# Patient Record
Sex: Female | Born: 1965 | Race: White | Hispanic: No | Marital: Married | Smoking: Never smoker
Health system: Southern US, Community
[De-identification: ages and names within clinical notes are randomized; demographics above are authoritative.]

## PROBLEM LIST (undated history)

## (undated) DIAGNOSIS — F419 Anxiety disorder, unspecified: Secondary | ICD-10-CM

## (undated) DIAGNOSIS — B019 Varicella without complication: Secondary | ICD-10-CM

## (undated) DIAGNOSIS — Z8669 Personal history of other diseases of the nervous system and sense organs: Secondary | ICD-10-CM

## (undated) HISTORY — DX: Varicella without complication: B01.9

## (undated) HISTORY — DX: Anxiety disorder, unspecified: F41.9

## (undated) HISTORY — DX: Personal history of other diseases of the nervous system and sense organs: Z86.69

## (undated) HISTORY — PX: BREAST CYST ASPIRATION: SHX578

## (undated) HISTORY — PX: WISDOM TOOTH EXTRACTION: SHX21

---

## 2000-02-09 ENCOUNTER — Other Ambulatory Visit: Admission: RE | Admit: 2000-02-09 | Discharge: 2000-02-09 | Payer: Self-pay | Admitting: Obstetrics and Gynecology

## 2001-04-04 ENCOUNTER — Other Ambulatory Visit: Admission: RE | Admit: 2001-04-04 | Discharge: 2001-04-04 | Payer: Self-pay | Admitting: Obstetrics and Gynecology

## 2002-06-19 ENCOUNTER — Other Ambulatory Visit: Admission: RE | Admit: 2002-06-19 | Discharge: 2002-06-19 | Payer: Self-pay | Admitting: Obstetrics and Gynecology

## 2004-05-02 ENCOUNTER — Ambulatory Visit: Payer: Self-pay | Admitting: Internal Medicine

## 2004-05-23 ENCOUNTER — Ambulatory Visit: Payer: Self-pay

## 2004-11-21 ENCOUNTER — Ambulatory Visit: Payer: Self-pay

## 2005-08-02 ENCOUNTER — Ambulatory Visit: Payer: Self-pay

## 2006-08-06 ENCOUNTER — Ambulatory Visit: Payer: Self-pay | Admitting: Internal Medicine

## 2007-08-22 ENCOUNTER — Ambulatory Visit: Payer: Self-pay | Admitting: Internal Medicine

## 2007-08-27 ENCOUNTER — Ambulatory Visit: Payer: Self-pay | Admitting: Internal Medicine

## 2008-09-17 ENCOUNTER — Ambulatory Visit: Payer: Self-pay | Admitting: Internal Medicine

## 2008-12-03 ENCOUNTER — Ambulatory Visit: Payer: Self-pay | Admitting: Internal Medicine

## 2009-05-28 HISTORY — PX: BREAST SURGERY: SHX581

## 2009-09-19 ENCOUNTER — Ambulatory Visit: Payer: Self-pay | Admitting: Internal Medicine

## 2010-02-15 ENCOUNTER — Ambulatory Visit: Payer: Self-pay

## 2010-08-07 ENCOUNTER — Ambulatory Visit: Payer: Self-pay

## 2011-02-08 ENCOUNTER — Ambulatory Visit: Payer: Self-pay | Admitting: Surgery

## 2012-03-28 ENCOUNTER — Ambulatory Visit: Payer: Self-pay

## 2013-05-14 ENCOUNTER — Ambulatory Visit: Payer: Self-pay

## 2014-04-26 ENCOUNTER — Encounter: Payer: Self-pay | Admitting: Internal Medicine

## 2014-04-26 ENCOUNTER — Ambulatory Visit (INDEPENDENT_AMBULATORY_CARE_PROVIDER_SITE_OTHER): Payer: 59 | Admitting: Internal Medicine

## 2014-04-26 ENCOUNTER — Encounter (INDEPENDENT_AMBULATORY_CARE_PROVIDER_SITE_OTHER): Payer: Self-pay

## 2014-04-26 VITALS — BP 106/70 | HR 55 | Temp 98.0°F | Ht 62.75 in | Wt 125.0 lb

## 2014-04-26 DIAGNOSIS — F411 Generalized anxiety disorder: Secondary | ICD-10-CM

## 2014-04-26 DIAGNOSIS — Z1211 Encounter for screening for malignant neoplasm of colon: Secondary | ICD-10-CM

## 2014-04-26 DIAGNOSIS — G43919 Migraine, unspecified, intractable, without status migrainosus: Secondary | ICD-10-CM

## 2014-04-26 DIAGNOSIS — G43909 Migraine, unspecified, not intractable, without status migrainosus: Secondary | ICD-10-CM | POA: Insufficient documentation

## 2014-04-26 DIAGNOSIS — Z0000 Encounter for general adult medical examination without abnormal findings: Secondary | ICD-10-CM

## 2014-04-26 MED ORDER — ALPRAZOLAM 0.25 MG PO TABS: 0.1250 mg | ORAL_TABLET | Freq: Every evening | ORAL | Status: DC | PRN

## 2014-04-26 MED ORDER — PROPRANOLOL HCL ER 60 MG PO CP24: 60.0000 mg | ORAL_CAPSULE | Freq: Every day | ORAL | Status: DC

## 2014-04-26 NOTE — Assessment & Plan Note (Signed)
Rare xanax use Medication refilled today

## 2014-04-26 NOTE — Assessment & Plan Note (Signed)
Well controlled on propanolol Medication refilled today

## 2014-04-26 NOTE — Progress Notes (Signed)
HPI  Pt presents to the clinic today to establish care. She is transferring care from [facility].  Flu: 02/2014 Tetanus: < 10 years ago LMP: 10/2012 Pap Smear: 2014- normal Mammogram: 03/2013 Colon Screening: never Visions Screening: yearly Dentist: biannually  Migraines: Has has migraines for the last 25-30 years. Rare migraines since she has been on the propanolol. Maybe 1 migraine per year. They are relieved by advil and sleeping in quiet/dark room.  Anxiety: Rare. Takes 1/2 xanax every 2-3 weeks to help take the edge off.  Past Medical History  Diagnosis Date  . Chicken pox   . History of migraine   . Anxiety     Current Outpatient Prescriptions  Medication Sig Dispense Refill  . ALPRAZolam (XANAX) 0.25 MG tablet Take 0.125 mg by mouth at bedtime as needed for anxiety.    . propranolol ER (INDERAL LA) 60 MG 24 hr capsule Take 60 mg by mouth daily.     No current facility-administered medications for this visit.    No Known Allergies  Family History  Problem Relation Age of Onset  . Arthritis Mother   . Arthritis Father     History   Social History  . Marital Status: Married    Spouse Name: N/A    Number of Children: N/A  . Years of Education: N/A   Occupational History  . Not on file.   Social History Main Topics  . Smoking status: Never Smoker   . Smokeless tobacco: Never Used  . Alcohol Use: 0.0 oz/week    0 Not specified per week     Comment: occasional  . Drug Use: Not on file  . Sexual Activity: Not on file   Other Topics Concern  . Not on file   Social History Narrative  . No narrative on file    ROS:  Constitutional: Denies fever, malaise, fatigue, headache or abrupt weight changes.  HEENT: Denies eye pain, eye redness, ear pain, ringing in the ears, wax buildup, runny nose, nasal congestion, bloody nose, or sore throat. Respiratory: Denies difficulty breathing, shortness of breath, cough or sputum production.   Cardiovascular:  Denies chest pain, chest tightness, palpitations or swelling in the hands or feet.  Gastrointestinal: Denies abdominal pain, bloating, constipation, diarrhea or blood in the stool.  GU: Denies frequency, urgency, pain with urination, blood in urine, odor or discharge. Musculoskeletal: Denies decrease in range of motion, difficulty with gait, muscle pain or joint pain and swelling.  Skin: Denies redness, rashes, lesions or ulcercations.  Neurological: Denies dizziness, difficulty with memory, difficulty with speech or problems with balance and coordination.   No other specific complaints in a complete review of systems (except as listed in HPI above).  PE:  BP 106/70 mmHg  Pulse 55  Temp(Src) 98 F (36.7 C) (Oral)  Ht 5' 2.75" (1.594 m)  Wt 125 lb (56.7 kg)  BMI 22.32 kg/m2  SpO2 99%  LMP 10/26/2012 Wt Readings from Last 3 Encounters:  04/26/14 125 lb (56.7 kg)    General: Appears her stated age, well developed, well nourished in NAD. HEENT: Head: normal shape and size; Eyes: sclera white, no icterus, conjunctiva pink, PERRLA and EOMs intact; Ears: Tm's gray and intact, normal light reflex; Nose: mucosa pink and moist, septum midline; Throat/Mouth: Teeth present, mucosa pink and moist, no lesions or ulcerations noted.  Neck: Neck supple, trachea midline. No masses, lumps or thyromegaly present.  Cardiovascular: Normal rate and rhythm. S1,S2 noted.  No murmur, rubs or gallops noted.  No JVD or BLE edema. No carotid bruits noted. Pulmonary/Chest: Normal effort and positive vesicular breath sounds. No respiratory distress. No wheezes, rales or ronchi noted.  Abdomen: Soft and nontender. Normal bowel sounds, no bruits noted. No distention or masses noted. Liver, spleen and kidneys non palpable. Musculoskeletal: Normal range of motion. Strength 5/5 BUE/BLE. No difficulty with gait.  Neurological: Alert and oriented. Cranial nerves II-XII grossly intact. Coordination normal.  Psychiatric:  Mood and affect normal. Behavior is normal. Judgment and thought content normal.     Assessment and Plan:  Preventative Health Maintenance:  She declines colonoscopy- will have her do IFOB yearly Will check CBC, CMET and Lipid profile All HM UTD Form filled out for insurance  RTC in 1 year or sooner if needed

## 2014-04-26 NOTE — Patient Instructions (Signed)

## 2014-04-26 NOTE — Progress Notes (Signed)
Pre visit review using our clinic review tool, if applicable. No additional management support is needed unless otherwise documented below in the visit note. 

## 2014-04-27 LAB — LIPID PANEL
CHOL/HDL RATIO: 3
Cholesterol: 186 mg/dL (ref 0–200)
HDL: 67.6 mg/dL (ref >39.00)
LDL Cholesterol: 101 mg/dL — ABNORMAL HIGH (ref 0–99)
NonHDL: 118.4
TRIGLYCERIDES: 89 mg/dL (ref 0.0–149.0)
VLDL: 17.8 mg/dL (ref 0.0–40.0)

## 2014-04-27 LAB — CBC
HCT: 38.9 % (ref 36.0–46.0)
HEMOGLOBIN: 12.8 g/dL (ref 12.0–15.0)
MCHC: 32.9 g/dL (ref 30.0–36.0)
MCV: 89.8 fl (ref 78.0–100.0)
PLATELETS: 291 10*3/uL (ref 150.0–400.0)
RBC: 4.33 Mil/uL (ref 3.87–5.11)
RDW: 13 % (ref 11.5–15.5)
WBC: 7.4 10*3/uL (ref 4.0–10.5)

## 2014-04-27 LAB — COMPREHENSIVE METABOLIC PANEL
ALBUMIN: 4.7 g/dL (ref 3.5–5.2)
ALT: 16 U/L (ref 0–35)
AST: 23 U/L (ref 0–37)
Alkaline Phosphatase: 63 U/L (ref 39–117)
BUN: 17 mg/dL (ref 6–23)
CALCIUM: 10 mg/dL (ref 8.4–10.5)
CHLORIDE: 100 meq/L (ref 96–112)
CO2: 30 meq/L (ref 19–32)
CREATININE: 0.8 mg/dL (ref 0.4–1.2)
GFR: 83.03 mL/min (ref >60.00)
Glucose, Bld: 84 mg/dL (ref 70–99)
POTASSIUM: 4.9 meq/L (ref 3.5–5.1)
Sodium: 137 mEq/L (ref 135–145)
Total Bilirubin: 0.7 mg/dL (ref 0.2–1.2)
Total Protein: 7.6 g/dL (ref 6.0–8.3)

## 2014-05-25 ENCOUNTER — Ambulatory Visit: Payer: Self-pay | Admitting: Internal Medicine

## 2014-05-26 ENCOUNTER — Other Ambulatory Visit: Payer: Self-pay | Admitting: Internal Medicine

## 2014-05-26 ENCOUNTER — Encounter: Payer: Self-pay | Admitting: Internal Medicine

## 2014-05-26 DIAGNOSIS — R928 Other abnormal and inconclusive findings on diagnostic imaging of breast: Secondary | ICD-10-CM

## 2014-05-28 HISTORY — PX: BREAST BIOPSY: SHX20

## 2014-06-03 ENCOUNTER — Ambulatory Visit: Payer: Self-pay | Admitting: Internal Medicine

## 2014-06-03 ENCOUNTER — Encounter: Payer: Self-pay | Admitting: Internal Medicine

## 2014-06-08 ENCOUNTER — Encounter: Payer: Self-pay | Admitting: Internal Medicine

## 2014-06-08 ENCOUNTER — Ambulatory Visit: Payer: Self-pay | Admitting: Internal Medicine

## 2014-06-11 ENCOUNTER — Encounter: Payer: Self-pay | Admitting: Internal Medicine

## 2014-09-20 LAB — SURGICAL PATHOLOGY

## 2014-10-27 ENCOUNTER — Other Ambulatory Visit: Payer: Self-pay

## 2014-10-27 DIAGNOSIS — F411 Generalized anxiety disorder: Secondary | ICD-10-CM

## 2014-10-27 DIAGNOSIS — G43919 Migraine, unspecified, intractable, without status migrainosus: Secondary | ICD-10-CM

## 2014-10-27 MED ORDER — PROPRANOLOL HCL ER 60 MG PO CP24: 60.0000 mg | ORAL_CAPSULE | Freq: Every day | ORAL | Status: DC

## 2014-10-27 MED ORDER — ALPRAZOLAM 0.25 MG PO TABS: 0.1250 mg | ORAL_TABLET | Freq: Every evening | ORAL | Status: DC | PRN

## 2014-10-27 NOTE — Telephone Encounter (Signed)
Rx called in to pharmacy. 

## 2014-10-27 NOTE — Telephone Encounter (Signed)
Propanolol sent to pharmacy electronically Ok to phone in Xanax

## 2014-10-27 NOTE — Telephone Encounter (Signed)
Pt left v/m requesting refill alprazolam to [facility]. And Inderal to [facility]. Pt seen to establish care 04/26/14;no future appt scheduled.Please advise.

## 2014-11-03 MED ORDER — PROPRANOLOL HCL ER 60 MG PO CP24: 60.0000 mg | ORAL_CAPSULE | Freq: Every day | ORAL | Status: DC

## 2014-11-03 NOTE — Addendum Note (Signed)
Addended by: Lurlean Nanny on: 11/03/2014 12:21 PM   Modules accepted: Orders

## 2014-11-03 NOTE — Telephone Encounter (Signed)
[facility] requires MD signature only--will resend in Dr Hulen Shouts name

## 2014-11-04 MED ORDER — PROPRANOLOL HCL ER 60 MG PO CP24: 60.0000 mg | ORAL_CAPSULE | Freq: Every day | ORAL | Status: DC

## 2014-11-04 NOTE — Addendum Note (Signed)
Addended by: Lurlean Nanny on: 11/04/2014 01:46 PM   Modules accepted: Orders

## 2015-03-25 ENCOUNTER — Other Ambulatory Visit: Payer: Self-pay | Admitting: Internal Medicine

## 2015-04-28 ENCOUNTER — Encounter: Payer: Self-pay | Admitting: Internal Medicine

## 2015-04-28 ENCOUNTER — Ambulatory Visit (INDEPENDENT_AMBULATORY_CARE_PROVIDER_SITE_OTHER): Payer: Commercial Managed Care - PPO | Admitting: Internal Medicine

## 2015-04-28 ENCOUNTER — Other Ambulatory Visit: Payer: Self-pay | Admitting: *Deleted

## 2015-04-28 VITALS — BP 110/74 | HR 83 | Temp 98.2°F | Ht 62.33 in | Wt 126.0 lb

## 2015-04-28 DIAGNOSIS — Z0000 Encounter for general adult medical examination without abnormal findings: Secondary | ICD-10-CM | POA: Diagnosis not present

## 2015-04-28 DIAGNOSIS — G43919 Migraine, unspecified, intractable, without status migrainosus: Secondary | ICD-10-CM | POA: Diagnosis not present

## 2015-04-28 DIAGNOSIS — F411 Generalized anxiety disorder: Secondary | ICD-10-CM | POA: Diagnosis not present

## 2015-04-28 DIAGNOSIS — Z1211 Encounter for screening for malignant neoplasm of colon: Secondary | ICD-10-CM

## 2015-04-28 LAB — LIPID PANEL
CHOLESTEROL: 156 mg/dL (ref 0–200)
HDL: 63.2 mg/dL (ref >39.00)
LDL CALC: 61 mg/dL (ref 0–99)
NonHDL: 92.43
TRIGLYCERIDES: 155 mg/dL — AB (ref 0.0–149.0)
Total CHOL/HDL Ratio: 2
VLDL: 31 mg/dL (ref 0.0–40.0)

## 2015-04-28 LAB — COMPREHENSIVE METABOLIC PANEL
ALBUMIN: 4.4 g/dL (ref 3.5–5.2)
ALT: 18 U/L (ref 0–35)
AST: 20 U/L (ref 0–37)
Alkaline Phosphatase: 66 U/L (ref 39–117)
BUN: 18 mg/dL (ref 6–23)
CALCIUM: 9.9 mg/dL (ref 8.4–10.5)
CO2: 32 meq/L (ref 19–32)
CREATININE: 0.89 mg/dL (ref 0.40–1.20)
Chloride: 101 mEq/L (ref 96–112)
GFR: 71.02 mL/min (ref >60.00)
Glucose, Bld: 100 mg/dL — ABNORMAL HIGH (ref 70–99)
Potassium: 4.4 mEq/L (ref 3.5–5.1)
SODIUM: 137 meq/L (ref 135–145)
Total Bilirubin: 0.5 mg/dL (ref 0.2–1.2)
Total Protein: 7.6 g/dL (ref 6.0–8.3)

## 2015-04-28 LAB — CBC
HCT: 38.8 % (ref 36.0–46.0)
Hemoglobin: 12.9 g/dL (ref 12.0–15.0)
MCHC: 33.3 g/dL (ref 30.0–36.0)
MCV: 88.6 fl (ref 78.0–100.0)
Platelets: 273 10*3/uL (ref 150.0–400.0)
RBC: 4.38 Mil/uL (ref 3.87–5.11)
RDW: 13.1 % (ref 11.5–15.5)
WBC: 6.6 10*3/uL (ref 4.0–10.5)

## 2015-04-28 LAB — VITAMIN D 25 HYDROXY (VIT D DEFICIENCY, FRACTURES): VITD: 35.53 ng/mL (ref 30.00–100.00)

## 2015-04-28 MED ORDER — ALPRAZOLAM 0.25 MG PO TABS: 0.1250 mg | ORAL_TABLET | Freq: Every evening | ORAL | Status: DC | PRN

## 2015-04-28 NOTE — Patient Instructions (Signed)
Health Maintenance, Female Adopting a healthy lifestyle and getting preventive care can go a long way to promote health and wellness. Talk with your health care provider about what schedule of regular examinations is right for you. This is a good chance for you to check in with your provider about disease prevention and staying healthy. In between checkups, there are plenty of things you can do on your own. Experts have done a lot of research about which lifestyle changes and preventive measures are most likely to keep you healthy. Ask your health care provider for more information. WEIGHT AND DIET  Eat a healthy diet  Be sure to include plenty of vegetables, fruits, low-fat dairy products, and lean protein.  Do not eat a lot of foods high in solid fats, added sugars, or salt.  Get regular exercise. This is one of the most important things you can do for your health.  Most adults should exercise for at least 150 minutes each week. The exercise should increase your heart rate and make you sweat (moderate-intensity exercise).  Most adults should also do strengthening exercises at least twice a week. This is in addition to the moderate-intensity exercise.  Maintain a healthy weight  Body mass index (BMI) is a measurement that can be used to identify possible weight problems. It estimates body fat based on height and weight. Your health care provider can help determine your BMI and help you achieve or maintain a healthy weight.  For females 20 years of age and older:   A BMI below 18.5 is considered underweight.  A BMI of 18.5 to 24.9 is normal.  A BMI of 25 to 29.9 is considered overweight.  A BMI of 30 and above is considered obese.  Watch levels of cholesterol and blood lipids  You should start having your blood tested for lipids and cholesterol at 51 years of age, then have this test every 5 years.  You may need to have your cholesterol levels checked more often if:  Your lipid  or cholesterol levels are high.  You are older than 50 years of age.  You are at high risk for heart disease.  CANCER SCREENING   Lung Cancer  Lung cancer screening is recommended for adults 55-80 years old who are at high risk for lung cancer because of a history of smoking.  A yearly low-dose CT scan of the lungs is recommended for people who:  Currently smoke.  Have quit within the past 15 years.  Have at least a 30-pack-year history of smoking. A pack year is smoking an average of one pack of cigarettes a day for 1 year.  Yearly screening should continue until it has been 15 years since you quit.  Yearly screening should stop if you develop a health problem that would prevent you from having lung cancer treatment.  Breast Cancer  Practice breast self-awareness. This means understanding how your breasts normally appear and feel.  It also means doing regular breast self-exams. Let your health care provider know about any changes, no matter how small.  If you are in your 20s or 30s, you should have a clinical breast exam (CBE) by a health care provider every 1-3 years as part of a regular health exam.  If you are 40 or older, have a CBE every year. Also consider having a breast X-ray (mammogram) every year.  If you have a family history of breast cancer, talk to your health care provider about genetic screening.  If you   are at high risk for breast cancer, talk to your health care provider about having an MRI and a mammogram every year.  Breast cancer gene (BRCA) assessment is recommended for women who have family members with BRCA-related cancers. BRCA-related cancers include:  Breast.  Ovarian.  Tubal.  Peritoneal cancers.  Results of the assessment will determine the need for genetic counseling and BRCA1 and BRCA2 testing. Cervical Cancer Your health care provider may recommend that you be screened regularly for cancer of the pelvic organs (ovaries, uterus, and  vagina). This screening involves a pelvic examination, including checking for microscopic changes to the surface of your cervix (Pap test). You may be encouraged to have this screening done every 3 years, beginning at age 21.  For women ages 30-65, health care providers may recommend pelvic exams and Pap testing every 3 years, or they may recommend the Pap and pelvic exam, combined with testing for human papilloma virus (HPV), every 5 years. Some types of HPV increase your risk of cervical cancer. Testing for HPV may also be done on women of any age with unclear Pap test results.  Other health care providers may not recommend any screening for nonpregnant women who are considered low risk for pelvic cancer and who do not have symptoms. Ask your health care provider if a screening pelvic exam is right for you.  If you have had past treatment for cervical cancer or a condition that could lead to cancer, you need Pap tests and screening for cancer for at least 20 years after your treatment. If Pap tests have been discontinued, your risk factors (such as having a new sexual partner) need to be reassessed to determine if screening should resume. Some women have medical problems that increase the chance of getting cervical cancer. In these cases, your health care provider may recommend more frequent screening and Pap tests. Colorectal Cancer  This type of cancer can be detected and often prevented.  Routine colorectal cancer screening usually begins at 50 years of age and continues through 51 years of age.  Your health care provider may recommend screening at an earlier age if you have risk factors for colon cancer.  Your health care provider may also recommend using home test kits to check for hidden blood in the stool.  A small camera at the end of a tube can be used to examine your colon directly (sigmoidoscopy or colonoscopy). This is done to check for the earliest forms of colorectal  cancer.  Routine screening usually begins at age 50.  Direct examination of the colon should be repeated every 5-10 years through 51 years of age. However, you may need to be screened more often if early forms of precancerous polyps or small growths are found. Skin Cancer  Check your skin from head to toe regularly.  Tell your health care provider about any new moles or changes in moles, especially if there is a change in a mole's shape or color.  Also tell your health care provider if you have a mole that is larger than the size of a pencil eraser.  Always use sunscreen. Apply sunscreen liberally and repeatedly throughout the day.  Protect yourself by wearing long sleeves, pants, a wide-brimmed hat, and sunglasses whenever you are outside. HEART DISEASE, DIABETES, AND HIGH BLOOD PRESSURE   High blood pressure causes heart disease and increases the risk of stroke. High blood pressure is more likely to develop in:  People who have blood pressure in the high end   of the normal range (130-139/85-89 mm Hg).  People who are overweight or obese.  People who are African American.  If you are 38-23 years of age, have your blood pressure checked every 3-5 years. If you are 61 years of age or older, have your blood pressure checked every year. You should have your blood pressure measured twice--once when you are at a hospital or clinic, and once when you are not at a hospital or clinic. Record the average of the two measurements. To check your blood pressure when you are not at a hospital or clinic, you can use:  An automated blood pressure machine at a pharmacy.  A home blood pressure monitor.  If you are between 45 years and 39 years old, ask your health care provider if you should take aspirin to prevent strokes.  Have regular diabetes screenings. This involves taking a blood sample to check your fasting blood sugar level.  If you are at a normal weight and have a low risk for diabetes,  have this test once every three years after 51 years of age.  If you are overweight and have a high risk for diabetes, consider being tested at a younger age or more often. PREVENTING INFECTION  Hepatitis B  If you have a higher risk for hepatitis B, you should be screened for this virus. You are considered at high risk for hepatitis B if:  You were born in a country where hepatitis B is common. Ask your health care provider which countries are considered high risk.  Your parents were born in a high-risk country, and you have not been immunized against hepatitis B (hepatitis B vaccine).  You have HIV or AIDS.  You use needles to inject street drugs.  You live with someone who has hepatitis B.  You have had sex with someone who has hepatitis B.  You get hemodialysis treatment.  You take certain medicines for conditions, including cancer, organ transplantation, and autoimmune conditions. Hepatitis C  Blood testing is recommended for:  Everyone born from [birth year] through 1967.  Anyone with known risk factors for hepatitis C. Sexually transmitted infections (STIs)  You should be screened for sexually transmitted infections (STIs) including gonorrhea and chlamydia if:  You are sexually active and are younger than 51 years of age.  You are older than 51 years of age and your health care provider tells you that you are at risk for this type of infection.  Your sexual activity has changed since you were last screened and you are at an increased risk for chlamydia or gonorrhea. Ask your health care provider if you are at risk.  If you do not have HIV, but are at risk, it may be recommended that you take a prescription medicine daily to prevent HIV infection. This is called pre-exposure prophylaxis (PrEP). You are considered at risk if:  You are sexually active and do not regularly use condoms or know the HIV status of your partner(s).  You take drugs by injection.  You are sexually  active with a partner who has HIV. Talk with your health care provider about whether you are at high risk of being infected with HIV. If you choose to begin PrEP, you should first be tested for HIV. You should then be tested every 3 months for as long as you are taking PrEP.  PREGNANCY   If you are premenopausal and you may become pregnant, ask your health care provider about preconception counseling.  If you may  become pregnant, take 400 to 800 micrograms (mcg) of folic acid every day.  If you want to prevent pregnancy, talk to your health care provider about birth control (contraception). OSTEOPOROSIS AND MENOPAUSE   Osteoporosis is a disease in which the bones lose minerals and strength with aging. This can result in serious bone fractures. Your risk for osteoporosis can be identified using a bone density scan.  If you are 61 years of age or older, or if you are at risk for osteoporosis and fractures, ask your health care provider if you should be screened.  Ask your health care provider whether you should take a calcium or vitamin D supplement to lower your risk for osteoporosis.  Menopause may have certain physical symptoms and risks.  Hormone replacement therapy may reduce some of these symptoms and risks. Talk to your health care provider about whether hormone replacement therapy is right for you.  HOME CARE INSTRUCTIONS   Schedule regular health, dental, and eye exams.  Stay current with your immunizations.   Do not use any tobacco products including cigarettes, chewing tobacco, or electronic cigarettes.  If you are pregnant, do not drink alcohol.  If you are breastfeeding, limit how much and how often you drink alcohol.  Limit alcohol intake to no more than 1 drink per day for nonpregnant women. One drink equals 12 ounces of beer, 5 ounces of wine, or 1 ounces of hard liquor.  Do not use street drugs.  Do not share needles.  Ask your health care provider for help if  you need support or information about quitting drugs.  Tell your health care provider if you often feel depressed.  Tell your health care provider if you have ever been abused or do not feel safe at home.   This information is not intended to replace advice given to you by your health care provider. Make sure you discuss any questions you have with your health care provider.   Document Released: 11/27/2010 Document Revised: 06/04/2014 Document Reviewed: 04/15/2013 Elsevier Interactive Patient Education [employer].

## 2015-04-28 NOTE — Progress Notes (Signed)
Pre visit review using our clinic review tool, if applicable. No additional management support is needed unless otherwise documented below in the visit note. 

## 2015-04-28 NOTE — Assessment & Plan Note (Signed)
Controlled on Propanolol

## 2015-04-28 NOTE — Assessment & Plan Note (Signed)
Controlled on Xanax prn

## 2015-04-28 NOTE — Progress Notes (Signed)
Subjective:    Patient ID: Veronica Leach, female    DOB: [birth year], 49 y.o.   MRN: [id number]  HPI  Pt presents to the clinic today for her annual exam. She is also due to follow up chronic conditions (see separate note)  Flu: 02/2015 Tetanus: < 10 years ago Pap Smear: 2014- normal Mammogram: 04/2014 Colon Screening: never Vision Screening: yearly Dentist: biannually  Diet: She does consume meat occasionally. She eats fruits and veggies daily. She tries to avoid fried foods. She drinks mostly green tea. Exercise: She walks 4 days a week.   Review of Systems      Past Medical History  Diagnosis Date  . Chicken pox   . History of migraine   . Anxiety     Current Outpatient Prescriptions  Medication Sig Dispense Refill  . ALPRAZolam (XANAX) 0.25 MG tablet Take 0.5 tablets (0.125 mg total) by mouth at bedtime as needed for anxiety. 30 tablet 0  . propranolol ER (INDERAL LA) 60 MG 24 hr capsule Take 1 capsule (60 mg total) by mouth daily. 90 capsule 1   No current facility-administered medications for this visit.    No Known Allergies  Family History  Problem Relation Age of Onset  . Arthritis Mother   . Arthritis Father     Social History   Social History  . Marital Status: Married    Spouse Name: N/A  . Number of Children: N/A  . Years of Education: N/A   Occupational History  . Not on file.   Social History Main Topics  . Smoking status: Never Smoker   . Smokeless tobacco: Never Used  . Alcohol Use: 0.0 oz/week    0 Standard drinks or equivalent per week     Comment: occasional  . Drug Use: Not on file  . Sexual Activity: Yes   Other Topics Concern  . Not on file   Social History Narrative     Constitutional: Denies fever, malaise, fatigue, headache or abrupt weight changes.  HEENT: Denies eye pain, eye redness, ear pain, ringing in the ears, wax buildup, runny nose, nasal congestion, bloody nose, or sore throat. Respiratory: Denies  difficulty breathing, shortness of breath, cough or sputum production.   Cardiovascular: Denies chest pain, chest tightness, palpitations or swelling in the hands or feet.  Gastrointestinal: Denies abdominal pain, bloating, constipation, diarrhea or blood in the stool.  GU: Denies urgency, frequency, pain with urination, burning sensation, blood in urine, odor or discharge. Musculoskeletal: Denies decrease in range of motion, difficulty with gait, muscle pain or joint pain and swelling.  Skin: Denies redness, rashes, lesions or ulcercations.  Neurological: Denies dizziness, difficulty with memory, difficulty with speech or problems with balance and coordination.  Psych: Pt reports history of anxiety. Denies depression, SI/HI.  No other specific complaints in a complete review of systems (except as listed in HPI above).  Objective:   Physical Exam  BP 110/74 mmHg  Pulse 83  Temp(Src) 98.2 F (36.8 C) (Oral)  Ht 5' 2.33" (1.583 m)  Wt 126 lb (57.153 kg)  BMI 22.81 kg/m2  SpO2 98%  LMP 10/26/2012 Wt Readings from Last 3 Encounters:  04/28/15 126 lb (57.153 kg)  04/26/14 125 lb (56.7 kg)    General: Appears her stated age, well developed, well nourished in NAD. Skin: Warm, dry and intact. No rashes, lesions or ulcerations noted. HEENT: Head: normal shape and size; Eyes: sclera white, no icterus, conjunctiva pink, PERRLA and EOMs intact; Ears: Tm's gray  and intact, normal light reflex; Throat/Mouth: Teeth present, mucosa pink and moist, no exudate, lesions or ulcerations noted.  Neck:  Neck supple, trachea midline. No masses, lumps or thyromegaly present.  Cardiovascular: Normal rate and rhythm. S1,S2 noted.  No murmur, rubs or gallops noted. No JVD or BLE edema. No carotid bruits noted. Pulmonary/Chest: Normal effort and positive vesicular breath sounds. No respiratory distress. No wheezes, rales or ronchi noted.  Abdomen: Soft and nontender. Normal bowel sounds. No distention or masses  noted. Liver, spleen and kidneys non palpable. Musculoskeletal: Strength 5/5 BUE/BLE. No signs of joint swelling. No difficulty with gait.  Neurological: Alert and oriented. Cranial nerves II-XII grossly intact. Coordination normal.  Psychiatric: Mood and affect normal. Behavior is normal. Judgment and thought content normal.    BMET    Component Value Date/Time   NA 137 04/26/2014 1548   K 4.9 04/26/2014 1548   CL 100 04/26/2014 1548   CO2 30 04/26/2014 1548   GLUCOSE 84 04/26/2014 1548   BUN 17 04/26/2014 1548   CREATININE 0.8 04/26/2014 1548   CALCIUM 10.0 04/26/2014 1548    Lipid Panel     Component Value Date/Time   CHOL 186 04/26/2014 1548   TRIG 89.0 04/26/2014 1548   HDL 67.60 04/26/2014 1548   CHOLHDL 3 04/26/2014 1548   VLDL 17.8 04/26/2014 1548   LDLCALC 101* 04/26/2014 1548    CBC    Component Value Date/Time   WBC 7.4 04/26/2014 1548   RBC 4.33 04/26/2014 1548   HGB 12.8 04/26/2014 1548   HCT 38.9 04/26/2014 1548   PLT 291.0 04/26/2014 1548   MCV 89.8 04/26/2014 1548   MCHC 32.9 04/26/2014 1548   RDW 13.0 04/26/2014 1548    Hgb A1C No results found for: HGBA1C       Assessment & Plan:   Preventative Health Maintenance:  Flu and Tetanus UTD Pap smear due 2017 Mammogram UTD She declines colonoscopy but is agreeable to IFOB- ordered today Encouraged her to continue to see an eye doctor and dentist annually Will check CBC, CMET, Lipid and Vit D today  RTC in 1 year or sooner if needed  HPI:  Pt presents to the clinic today to follow up chronic conditions:  Anxiety: Chronic but stable. She only takes 1/2 Xanax every 2 weeks. She does request a refill today.  Migraines: She has not had a migraine in 3-4 years. She has tried to come off the Propanolol in the past but had rebound headaches. She would like to continue it for now.  Review of Systems:   Past Medical History  Diagnosis Date  . Chicken pox   . History of migraine   .  Anxiety     Current Outpatient Prescriptions  Medication Sig Dispense Refill  . ALPRAZolam (XANAX) 0.25 MG tablet Take 0.5 tablets (0.125 mg total) by mouth at bedtime as needed for anxiety. 30 tablet 0  . propranolol ER (INDERAL LA) 60 MG 24 hr capsule Take 1 capsule (60 mg total) by mouth daily. 90 capsule 1   No current facility-administered medications for this visit.    No Known Allergies  Family History  Problem Relation Age of Onset  . Arthritis Mother   . Arthritis Father     Social History   Social History  . Marital Status: Married    Spouse Name: N/A  . Number of Children: N/A  . Years of Education: N/A   Occupational History  . Not on file.  Social History Main Topics  . Smoking status: Never Smoker   . Smokeless tobacco: Never Used  . Alcohol Use: 0.0 oz/week    0 Standard drinks or equivalent per week     Comment: occasional  . Drug Use: Not on file  . Sexual Activity: Yes   Other Topics Concern  . Not on file   Social History Narrative     Constitutional: Denies fever, malaise, fatigue, headache or abrupt weight changes.  Respiratory: Denies difficulty breathing, shortness of breath, cough or sputum production.   Cardiovascular: Denies chest pain, chest tightness, palpitations or swelling in the hands or feet.  Neurological: Denies dizziness, difficulty with memory, difficulty with speech or problems with balance and coordination.  Psych: Pt reports history of anxiety. Denies depression, SI/HI.  No other specific complaints in a complete review of systems (except as listed in HPI above).  Objective:  BP 110/74 mmHg  Pulse 83  Temp(Src) 98.2 F (36.8 C) (Oral)  Ht 5' 2.33" (1.583 m)  Wt 126 lb (57.153 kg)  BMI 22.81 kg/m2  SpO2 98%  LMP 10/26/2012  BP 110/74 mmHg  Pulse 83  Temp(Src) 98.2 F (36.8 C) (Oral)  Ht 5' 2.33" (1.583 m)  Wt 126 lb (57.153 kg)  BMI 22.81 kg/m2  SpO2 98%  LMP 10/26/2012 Wt Readings from Last 3  Encounters:  04/28/15 126 lb (57.153 kg)  04/26/14 125 lb (56.7 kg)    General: Appears her stated age, well developed, well nourished in NAD. Cardiovascular: Normal rate and rhythm. S1,S2 noted.  No murmur, rubs or gallops noted. Pulmonary/Chest: Normal effort and positive vesicular breath sounds. No respiratory distress. No wheezes, rales or ronchi noted.  Neurological: Alert and oriented.  Psychiatric: Mood and affect normal. Behavior is normal. Judgment and thought content normal.    BMET    Component Value Date/Time   NA 137 04/26/2014 1548   K 4.9 04/26/2014 1548   CL 100 04/26/2014 1548   CO2 30 04/26/2014 1548   GLUCOSE 84 04/26/2014 1548   BUN 17 04/26/2014 1548   CREATININE 0.8 04/26/2014 1548   CALCIUM 10.0 04/26/2014 1548    Lipid Panel     Component Value Date/Time   CHOL 186 04/26/2014 1548   TRIG 89.0 04/26/2014 1548   HDL 67.60 04/26/2014 1548   CHOLHDL 3 04/26/2014 1548   VLDL 17.8 04/26/2014 1548   LDLCALC 101* 04/26/2014 1548    CBC    Component Value Date/Time   WBC 7.4 04/26/2014 1548   RBC 4.33 04/26/2014 1548   HGB 12.8 04/26/2014 1548   HCT 38.9 04/26/2014 1548   PLT 291.0 04/26/2014 1548   MCV 89.8 04/26/2014 1548   MCHC 32.9 04/26/2014 1548   RDW 13.0 04/26/2014 1548    Hgb A1C No results found for: HGBA1C  Assessment and Plan:

## 2015-06-07 ENCOUNTER — Other Ambulatory Visit: Payer: Self-pay | Admitting: Internal Medicine

## 2015-06-07 DIAGNOSIS — Z1231 Encounter for screening mammogram for malignant neoplasm of breast: Secondary | ICD-10-CM

## 2015-06-15 ENCOUNTER — Ambulatory Visit
Admission: RE | Admit: 2015-06-15 | Discharge: 2015-06-15 | Disposition: A | Discharge status: Home / Self Care | Payer: Commercial Managed Care - PPO | Source: Ambulatory Visit | Attending: Internal Medicine | Admitting: Internal Medicine

## 2015-06-15 DIAGNOSIS — Z1231 Encounter for screening mammogram for malignant neoplasm of breast: Secondary | ICD-10-CM

## 2015-06-23 ENCOUNTER — Other Ambulatory Visit: Payer: Self-pay | Admitting: Internal Medicine

## 2015-07-26 ENCOUNTER — Other Ambulatory Visit: Payer: Self-pay

## 2015-07-26 MED ORDER — PROPRANOLOL HCL ER 60 MG PO CP24: 60.0000 mg | ORAL_CAPSULE | Freq: Every day | ORAL | Status: DC

## 2015-07-29 ENCOUNTER — Other Ambulatory Visit: Payer: Self-pay

## 2015-07-29 NOTE — Telephone Encounter (Signed)
error 

## 2016-05-01 ENCOUNTER — Other Ambulatory Visit: Payer: Self-pay | Admitting: Internal Medicine

## 2016-05-15 ENCOUNTER — Ambulatory Visit (INDEPENDENT_AMBULATORY_CARE_PROVIDER_SITE_OTHER): Payer: Commercial Managed Care - PPO | Admitting: Internal Medicine

## 2016-05-15 ENCOUNTER — Encounter: Payer: Self-pay | Admitting: Internal Medicine

## 2016-05-15 VITALS — BP 104/64 | HR 75 | Temp 98.0°F | Ht 62.33 in | Wt 126.0 lb

## 2016-05-15 DIAGNOSIS — Z0000 Encounter for general adult medical examination without abnormal findings: Secondary | ICD-10-CM

## 2016-05-15 DIAGNOSIS — Z124 Encounter for screening for malignant neoplasm of cervix: Secondary | ICD-10-CM | POA: Diagnosis not present

## 2016-05-15 DIAGNOSIS — F411 Generalized anxiety disorder: Secondary | ICD-10-CM

## 2016-05-15 DIAGNOSIS — N952 Postmenopausal atrophic vaginitis: Secondary | ICD-10-CM | POA: Diagnosis not present

## 2016-05-15 MED ORDER — ESTROGENS, CONJUGATED 0.625 MG/GM VA CREA: 1.0000 | TOPICAL_CREAM | Freq: Every day | VAGINAL | 12 refills | Status: DC

## 2016-05-15 MED ORDER — ALPRAZOLAM 0.25 MG PO TABS: 0.1250 mg | ORAL_TABLET | Freq: Every evening | ORAL | 0 refills | Status: DC | PRN

## 2016-05-15 NOTE — Patient Instructions (Signed)

## 2016-05-15 NOTE — Progress Notes (Signed)
Subjective:    Patient ID: Veronica Leach, female    DOB: 1965/09/27, 50 y.o.   MRN: [id number]  HPI  Pt presents to the clinic today for her annual exam. She is requesting a refill of her Xanax today.  Flu: 02/2016 Tetanus: 2011 Pap Smear: 2014 Mammogram: 05/2015 Colon Screening: never Vision Screening: yearly Dentist: biannually  Diet: She does eat meat. She consumes fruits and veggies daily. She tries to avoid fried foods. She drinks mostly water and green tea. Exercise: She walks 3 x week.  Review of Systems      Past Medical History:  Diagnosis Date  . Anxiety   . Chicken pox   . History of migraine     Current Outpatient Prescriptions  Medication Sig Dispense Refill  . ALPRAZolam (XANAX) 0.25 MG tablet Take 0.5 tablets (0.125 mg total) by mouth at bedtime as needed for anxiety. 30 tablet 0  . propranolol ER (INDERAL LA) 60 MG 24 hr capsule Take 1 capsule (60 mg total) by mouth daily. 90 capsule 3   No current facility-administered medications for this visit.     No Known Allergies  Family History  Problem Relation Age of Onset  . Arthritis Mother   . Arthritis Father   . Breast cancer Maternal Aunt 80    Social History   Social History  . Marital status: Married    Spouse name: N/A  . Number of children: N/A  . Years of education: N/A   Occupational History  . Not on file.   Social History Main Topics  . Smoking status: Never Smoker  . Smokeless tobacco: Never Used  . Alcohol use 0.0 oz/week     Comment: occasional  . Drug use: Unknown  . Sexual activity: Yes   Other Topics Concern  . Not on file   Social History Narrative  . No narrative on file     Constitutional: Denies fever, malaise, fatigue, headache or abrupt weight changes.  HEENT: Denies eye pain, eye redness, ear pain, ringing in the ears, wax buildup, runny nose, nasal congestion, bloody nose, or sore throat. Respiratory: Denies difficulty breathing, shortness of breath,  cough or sputum production.   Cardiovascular: {t reports intermittent chest tightness (anxiety related). Denies chest pain, chest tightness, palpitations or swelling in the hands or feet.  Gastrointestinal: Denies abdominal pain, bloating, constipation, diarrhea or blood in the stool.  GU: Pt reports vaginal dryness and painful intercourse. Denies urgency, frequency, pain with urination, burning sensation, blood in urine, odor or discharge. Musculoskeletal: Denies decrease in range of motion, difficulty with gait, muscle pain or joint pain and swelling.  Skin: Denies redness, rashes, lesions or ulcercations.  Neurological: Denies dizziness, difficulty with memory, difficulty with speech or problems with balance and coordination.  Psych: Pt has a history of anxiety. Denies depression, SI/HI.  No other specific complaints in a complete review of systems (except as listed in HPI above).  Objective:   Physical Exam   BP 104/64   Pulse 75   Temp 98 F (36.7 C) (Oral)   Ht 5' 2.33" (1.583 m)   Wt 126 lb (57.2 kg)   LMP 10/26/2012   SpO2 98%   BMI 22.80 kg/m  Wt Readings from Last 3 Encounters:  05/15/16 126 lb (57.2 kg)  04/28/15 126 lb (57.2 kg)  04/26/14 125 lb (56.7 kg)    General: Appears her stated age, well developed, well nourished in NAD. Skin: Warm, dry and intact.  HEENT: Head: normal  shape and size; Eyes: sclera white, no icterus, conjunctiva pink, PERRLA and EOMs intact; Ears: Tm's gray and intact, normal light reflex; Throat/Mouth: Teeth present, mucosa pink and moist, no exudate, lesions or ulcerations noted.  Neck:  Neck supple, trachea midline. No masses, lumps or thyromegaly present.  Cardiovascular: Normal rate and rhythm. S1,S2 noted.  No murmur, rubs or gallops noted. No JVD or BLE edema. No carotid bruits noted. Pulmonary/Chest: Normal effort and positive vesicular breath sounds. No respiratory distress. No wheezes, rales or ronchi noted.  Abdomen: Soft and  nontender. Normal bowel sounds. No distention or masses noted. Liver, spleen and kidneys non palpable. Pelvic: Normal female anatomy. Cervix without changes. No discharge noted. Adnexa nonpalpable. Musculoskeletal: Normal range of motion. Strength 5/5 BUE/BLE. No difficulty with gait.  Neurological: Alert and oriented. Cranial nerves II-XII grossly intact. Coordination normal.  Psychiatric: Mood and affect normal. Behavior is normal. Judgment and thought content normal.    BMET    Component Value Date/Time   NA 137 04/28/2015 1511   K 4.4 04/28/2015 1511   CL 101 04/28/2015 1511   CO2 32 04/28/2015 1511   GLUCOSE 100 (H) 04/28/2015 1511   BUN 18 04/28/2015 1511   CREATININE 0.89 04/28/2015 1511   CALCIUM 9.9 04/28/2015 1511    Lipid Panel     Component Value Date/Time   CHOL 156 04/28/2015 1511   TRIG 155.0 (H) 04/28/2015 1511   HDL 63.20 04/28/2015 1511   CHOLHDL 2 04/28/2015 1511   VLDL 31.0 04/28/2015 1511   LDLCALC 61 04/28/2015 1511    CBC    Component Value Date/Time   WBC 6.6 04/28/2015 1511   RBC 4.38 04/28/2015 1511   HGB 12.9 04/28/2015 1511   HCT 38.8 04/28/2015 1511   PLT 273.0 04/28/2015 1511   MCV 88.6 04/28/2015 1511   MCHC 33.3 04/28/2015 1511   RDW 13.1 04/28/2015 1511    Hgb A1C No results found for: HGBA1C         Assessment & Plan:   Preventative Health Maintenance:  Flu and tetanus UTD Pap smear today- she declines STD screening She will call to make an appt for her mammogram 05/2016 She declines colonoscopy, but is agreeable to Cologuard- ordered Encouraged her to consume a balanced diet and exercise regimen Advised her to see an eye doctor and dentist annually Will check CBC, CMET, Lipid profile today  Atrophic Vaginitis:  Discussed using a water based lubricant prior to intercourse eRx for Premarin cream, use daily RTC in 1 year, sooner if needed Webb Silversmith, NP

## 2016-05-15 NOTE — Assessment & Plan Note (Signed)
Xanax refilled today. ?

## 2016-05-15 NOTE — Addendum Note (Signed)
Addended by: Lurlean Nanny on: 05/15/2016 05:10 PM   Modules accepted: Orders

## 2016-05-16 ENCOUNTER — Other Ambulatory Visit (HOSPITAL_COMMUNITY)
Admission: RE | Admit: 2016-05-16 | Discharge: 2016-05-16 | Disposition: A | Discharge status: Home / Self Care | Payer: Commercial Managed Care - PPO | Source: Ambulatory Visit | Attending: Internal Medicine | Admitting: Internal Medicine

## 2016-05-16 DIAGNOSIS — Z1151 Encounter for screening for human papillomavirus (HPV): Secondary | ICD-10-CM | POA: Diagnosis present

## 2016-05-16 DIAGNOSIS — Z01411 Encounter for gynecological examination (general) (routine) with abnormal findings: Secondary | ICD-10-CM | POA: Insufficient documentation

## 2016-05-16 LAB — COMPREHENSIVE METABOLIC PANEL
ALT: 12 U/L (ref 0–35)
AST: 18 U/L (ref 0–37)
Albumin: 4.8 g/dL (ref 3.5–5.2)
Alkaline Phosphatase: 70 U/L (ref 39–117)
BUN: 16 mg/dL (ref 6–23)
CALCIUM: 10 mg/dL (ref 8.4–10.5)
CHLORIDE: 100 meq/L (ref 96–112)
CO2: 31 meq/L (ref 19–32)
CREATININE: 0.84 mg/dL (ref 0.40–1.20)
GFR: 75.61 mL/min (ref >60.00)
Glucose, Bld: 90 mg/dL (ref 70–99)
Potassium: 4.2 mEq/L (ref 3.5–5.1)
Sodium: 138 mEq/L (ref 135–145)
Total Bilirubin: 0.5 mg/dL (ref 0.2–1.2)
Total Protein: 7.8 g/dL (ref 6.0–8.3)

## 2016-05-16 LAB — LIPID PANEL
CHOL/HDL RATIO: 3
Cholesterol: 168 mg/dL (ref 0–200)
HDL: 65.7 mg/dL (ref >39.00)
LDL CALC: 77 mg/dL (ref 0–99)
NonHDL: 102.52
TRIGLYCERIDES: 127 mg/dL (ref 0.0–149.0)
VLDL: 25.4 mg/dL (ref 0.0–40.0)

## 2016-05-16 LAB — CBC
HCT: 39.2 % (ref 36.0–46.0)
Hemoglobin: 13.3 g/dL (ref 12.0–15.0)
MCHC: 33.9 g/dL (ref 30.0–36.0)
MCV: 87.3 fl (ref 78.0–100.0)
PLATELETS: 297 10*3/uL (ref 150.0–400.0)
RBC: 4.49 Mil/uL (ref 3.87–5.11)
RDW: 12.8 % (ref 11.5–15.5)
WBC: 6.6 10*3/uL (ref 4.0–10.5)

## 2016-05-16 NOTE — Addendum Note (Signed)
Addended by: Lurlean Nanny on: 05/16/2016 10:32 AM   Modules accepted: Orders

## 2016-05-18 MED ORDER — ESTROGENS, CONJUGATED 0.625 MG/GM VA CREA: TOPICAL_CREAM | VAGINAL | 1 refills | Status: DC

## 2016-05-18 NOTE — Addendum Note (Signed)
Addended by: Lurlean Nanny on: 05/18/2016 02:47 PM   Modules accepted: Orders

## 2016-05-22 LAB — CYTOLOGY - PAP
DIAGNOSIS: UNDETERMINED — AB
HPV: NOT DETECTED

## 2016-05-24 ENCOUNTER — Other Ambulatory Visit: Payer: Self-pay | Admitting: Internal Medicine

## 2016-05-24 DIAGNOSIS — Z1231 Encounter for screening mammogram for malignant neoplasm of breast: Secondary | ICD-10-CM

## 2016-06-20 DIAGNOSIS — Z1212 Encounter for screening for malignant neoplasm of rectum: Secondary | ICD-10-CM | POA: Diagnosis not present

## 2016-06-20 DIAGNOSIS — Z1211 Encounter for screening for malignant neoplasm of colon: Secondary | ICD-10-CM | POA: Diagnosis not present

## 2016-06-22 ENCOUNTER — Ambulatory Visit
Admission: RE | Admit: 2016-06-22 | Discharge: 2016-06-22 | Disposition: A | Discharge status: Home / Self Care | Payer: Commercial Managed Care - PPO | Source: Ambulatory Visit | Attending: Internal Medicine | Admitting: Internal Medicine

## 2016-06-22 DIAGNOSIS — Z1231 Encounter for screening mammogram for malignant neoplasm of breast: Secondary | ICD-10-CM

## 2016-06-27 LAB — COLOGUARD: Cologuard: NEGATIVE

## 2016-06-28 ENCOUNTER — Encounter: Payer: Self-pay | Admitting: Internal Medicine

## 2016-07-02 ENCOUNTER — Other Ambulatory Visit: Payer: Self-pay | Admitting: Family Medicine

## 2016-10-11 DIAGNOSIS — D225 Melanocytic nevi of trunk: Secondary | ICD-10-CM | POA: Diagnosis not present

## 2016-10-11 DIAGNOSIS — D2261 Melanocytic nevi of right upper limb, including shoulder: Secondary | ICD-10-CM | POA: Diagnosis not present

## 2016-10-11 DIAGNOSIS — Z85828 Personal history of other malignant neoplasm of skin: Secondary | ICD-10-CM | POA: Diagnosis not present

## 2016-10-12 DIAGNOSIS — M7502 Adhesive capsulitis of left shoulder: Secondary | ICD-10-CM | POA: Diagnosis not present

## 2016-10-16 DIAGNOSIS — M7542 Impingement syndrome of left shoulder: Secondary | ICD-10-CM | POA: Diagnosis not present

## 2016-10-16 DIAGNOSIS — M7502 Adhesive capsulitis of left shoulder: Secondary | ICD-10-CM | POA: Diagnosis not present

## 2016-10-16 DIAGNOSIS — M25512 Pain in left shoulder: Secondary | ICD-10-CM | POA: Diagnosis not present

## 2016-10-18 DIAGNOSIS — M25512 Pain in left shoulder: Secondary | ICD-10-CM | POA: Diagnosis not present

## 2016-10-18 DIAGNOSIS — M7542 Impingement syndrome of left shoulder: Secondary | ICD-10-CM | POA: Diagnosis not present

## 2016-10-18 DIAGNOSIS — M7502 Adhesive capsulitis of left shoulder: Secondary | ICD-10-CM | POA: Diagnosis not present

## 2016-10-23 DIAGNOSIS — M25512 Pain in left shoulder: Secondary | ICD-10-CM | POA: Diagnosis not present

## 2016-10-23 DIAGNOSIS — M7542 Impingement syndrome of left shoulder: Secondary | ICD-10-CM | POA: Diagnosis not present

## 2016-10-23 DIAGNOSIS — M7502 Adhesive capsulitis of left shoulder: Secondary | ICD-10-CM | POA: Diagnosis not present

## 2016-10-25 DIAGNOSIS — M7502 Adhesive capsulitis of left shoulder: Secondary | ICD-10-CM | POA: Diagnosis not present

## 2016-10-25 DIAGNOSIS — M7542 Impingement syndrome of left shoulder: Secondary | ICD-10-CM | POA: Diagnosis not present

## 2016-10-25 DIAGNOSIS — M25512 Pain in left shoulder: Secondary | ICD-10-CM | POA: Diagnosis not present

## 2016-10-31 DIAGNOSIS — M7542 Impingement syndrome of left shoulder: Secondary | ICD-10-CM | POA: Diagnosis not present

## 2016-10-31 DIAGNOSIS — M7502 Adhesive capsulitis of left shoulder: Secondary | ICD-10-CM | POA: Diagnosis not present

## 2016-10-31 DIAGNOSIS — M25512 Pain in left shoulder: Secondary | ICD-10-CM | POA: Diagnosis not present

## 2016-11-06 DIAGNOSIS — M7502 Adhesive capsulitis of left shoulder: Secondary | ICD-10-CM | POA: Diagnosis not present

## 2016-11-06 DIAGNOSIS — M7542 Impingement syndrome of left shoulder: Secondary | ICD-10-CM | POA: Diagnosis not present

## 2016-11-06 DIAGNOSIS — M25512 Pain in left shoulder: Secondary | ICD-10-CM | POA: Diagnosis not present

## 2016-11-08 DIAGNOSIS — M25512 Pain in left shoulder: Secondary | ICD-10-CM | POA: Diagnosis not present

## 2016-11-08 DIAGNOSIS — M7542 Impingement syndrome of left shoulder: Secondary | ICD-10-CM | POA: Diagnosis not present

## 2016-11-08 DIAGNOSIS — M7502 Adhesive capsulitis of left shoulder: Secondary | ICD-10-CM | POA: Diagnosis not present

## 2016-11-23 DIAGNOSIS — M7502 Adhesive capsulitis of left shoulder: Secondary | ICD-10-CM | POA: Diagnosis not present

## 2017-03-07 ENCOUNTER — Other Ambulatory Visit: Payer: Self-pay | Admitting: Internal Medicine

## 2017-03-07 DIAGNOSIS — F411 Generalized anxiety disorder: Secondary | ICD-10-CM

## 2017-03-07 NOTE — Telephone Encounter (Signed)
Last office visit 05/15/2016.  Last refilled 05/15/2016 for #30 with no refills.  Ok to refill?

## 2017-03-08 MED ORDER — ALPRAZOLAM 0.25 MG PO TABS: 0.1250 mg | ORAL_TABLET | Freq: Every evening | ORAL | 0 refills | Status: DC | PRN

## 2017-03-08 NOTE — Telephone Encounter (Signed)
Ok to phone in Xanax 

## 2017-03-08 NOTE — Telephone Encounter (Signed)
Alprazolam called into [id number] [address], [place] Phone: [phone]

## 2017-03-13 DIAGNOSIS — J988 Other specified respiratory disorders: Secondary | ICD-10-CM | POA: Diagnosis not present

## 2017-03-13 DIAGNOSIS — M791 Myalgia, unspecified site: Secondary | ICD-10-CM | POA: Diagnosis not present

## 2017-04-30 ENCOUNTER — Other Ambulatory Visit (HOSPITAL_COMMUNITY)
Admission: RE | Admit: 2017-04-30 | Discharge: 2017-04-30 | Disposition: A | Discharge status: Home / Self Care | Payer: Commercial Managed Care - PPO | Source: Ambulatory Visit | Attending: Internal Medicine | Admitting: Internal Medicine

## 2017-04-30 ENCOUNTER — Encounter: Payer: Self-pay | Admitting: Internal Medicine

## 2017-04-30 ENCOUNTER — Ambulatory Visit (INDEPENDENT_AMBULATORY_CARE_PROVIDER_SITE_OTHER): Payer: Commercial Managed Care - PPO | Admitting: Internal Medicine

## 2017-04-30 VITALS — BP 102/64 | HR 62 | Temp 97.8°F | Ht 62.33 in | Wt 126.0 lb

## 2017-04-30 DIAGNOSIS — Z124 Encounter for screening for malignant neoplasm of cervix: Secondary | ICD-10-CM

## 2017-04-30 DIAGNOSIS — F411 Generalized anxiety disorder: Secondary | ICD-10-CM

## 2017-04-30 DIAGNOSIS — G43C1 Periodic headache syndromes in child or adult, intractable: Secondary | ICD-10-CM

## 2017-04-30 DIAGNOSIS — F419 Anxiety disorder, unspecified: Secondary | ICD-10-CM | POA: Diagnosis not present

## 2017-04-30 DIAGNOSIS — Z0000 Encounter for general adult medical examination without abnormal findings: Secondary | ICD-10-CM | POA: Diagnosis not present

## 2017-04-30 DIAGNOSIS — Z13220 Encounter for screening for lipoid disorders: Secondary | ICD-10-CM

## 2017-04-30 DIAGNOSIS — E559 Vitamin D deficiency, unspecified: Secondary | ICD-10-CM

## 2017-04-30 LAB — VITAMIN D 25 HYDROXY (VIT D DEFICIENCY, FRACTURES): VITD: 31.01 ng/mL (ref 30.00–100.00)

## 2017-04-30 LAB — COMPREHENSIVE METABOLIC PANEL
ALT: 11 U/L (ref 0–35)
AST: 18 U/L (ref 0–37)
Albumin: 4.8 g/dL (ref 3.5–5.2)
Alkaline Phosphatase: 61 U/L (ref 39–117)
BUN: 15 mg/dL (ref 6–23)
CHLORIDE: 100 meq/L (ref 96–112)
CO2: 32 meq/L (ref 19–32)
CREATININE: 0.79 mg/dL (ref 0.40–1.20)
Calcium: 9.9 mg/dL (ref 8.4–10.5)
GFR: 80.86 mL/min (ref >60.00)
Glucose, Bld: 93 mg/dL (ref 70–99)
POTASSIUM: 4.2 meq/L (ref 3.5–5.1)
SODIUM: 137 meq/L (ref 135–145)
Total Bilirubin: 0.5 mg/dL (ref 0.2–1.2)
Total Protein: 7.6 g/dL (ref 6.0–8.3)

## 2017-04-30 LAB — LIPID PANEL
CHOL/HDL RATIO: 3
CHOLESTEROL: 176 mg/dL (ref 0–200)
HDL: 67 mg/dL (ref >39.00)
LDL CALC: 90 mg/dL (ref 0–99)
NonHDL: 109.44
Triglycerides: 99 mg/dL (ref 0.0–149.0)
VLDL: 19.8 mg/dL (ref 0.0–40.0)

## 2017-04-30 LAB — CBC
HEMATOCRIT: 39.9 % (ref 36.0–46.0)
Hemoglobin: 13.4 g/dL (ref 12.0–15.0)
MCHC: 33.5 g/dL (ref 30.0–36.0)
MCV: 89.5 fl (ref 78.0–100.0)
Platelets: 296 10*3/uL (ref 150.0–400.0)
RBC: 4.46 Mil/uL (ref 3.87–5.11)
RDW: 13 % (ref 11.5–15.5)
WBC: 5.4 10*3/uL (ref 4.0–10.5)

## 2017-04-30 NOTE — Assessment & Plan Note (Signed)
Stable on Propanolol Monitor

## 2017-04-30 NOTE — Addendum Note (Signed)
Addended by: Lindalou Hose Y on: 04/30/2017 05:00 PM   Modules accepted: Orders

## 2017-04-30 NOTE — Patient Instructions (Signed)
Health Maintenance for Postmenopausal Women Menopause is a normal process in which your reproductive ability comes to an end. This process happens gradually over a span of months to years, usually between the ages of 22 and 9. Menopause is complete when you have missed 12 consecutive menstrual periods. It is important to talk with your health care provider about some of the most common conditions that affect postmenopausal women, such as heart disease, cancer, and bone loss (osteoporosis). Adopting a healthy lifestyle and getting preventive care can help to promote your health and wellness. Those actions can also lower your chances of developing some of these common conditions. What should I know about menopause? During menopause, you may experience a number of symptoms, such as:  Moderate-to-severe hot flashes.  Night sweats.  Decrease in sex drive.  Mood swings.  Headaches.  Tiredness.  Irritability.  Memory problems.  Insomnia.  Choosing to treat or not to treat menopausal changes is an individual decision that you make with your health care provider. What should I know about hormone replacement therapy and supplements? Hormone therapy products are effective for treating symptoms that are associated with menopause, such as hot flashes and night sweats. Hormone replacement carries certain risks, especially as you become older. If you are thinking about using estrogen or estrogen with progestin treatments, discuss the benefits and risks with your health care provider. What should I know about heart disease and stroke? Heart disease, heart attack, and stroke become more likely as you age. This may be due, in part, to the hormonal changes that your body experiences during menopause. These can affect how your body processes dietary fats, triglycerides, and cholesterol. Heart attack and stroke are both medical emergencies. There are many things that you can do to help prevent heart disease  and stroke:  Have your blood pressure checked at least every 1-2 years. High blood pressure causes heart disease and increases the risk of stroke.  If you are 53-22 years old, ask your health care provider if you should take aspirin to prevent a heart attack or a stroke.  Do not use any tobacco products, including cigarettes, chewing tobacco, or electronic cigarettes. If you need help quitting, ask your health care provider.  It is important to eat a healthy diet and maintain a healthy weight. ? Be sure to include plenty of vegetables, fruits, low-fat dairy products, and lean protein. ? Avoid eating foods that are high in solid fats, added sugars, or salt (sodium).  Get regular exercise. This is one of the most important things that you can do for your health. ? Try to exercise for at least 150 minutes each week. The type of exercise that you do should increase your heart rate and make you sweat. This is known as moderate-intensity exercise. ? Try to do strengthening exercises at least twice each week. Do these in addition to the moderate-intensity exercise.  Know your numbers.Ask your health care provider to check your cholesterol and your blood glucose. Continue to have your blood tested as directed by your health care provider.  What should I know about cancer screening? There are several types of cancer. Take the following steps to reduce your risk and to catch any cancer development as early as possible. Breast Cancer  Practice breast self-awareness. ? This means understanding how your breasts normally appear and feel. ? It also means doing regular breast self-exams. Let your health care provider know about any changes, no matter how small.  If you are 40  or older, have a clinician do a breast exam (clinical breast exam or CBE) every year. Depending on your age, family history, and medical history, it may be recommended that you also have a yearly breast X-ray (mammogram).  If you  have a family history of breast cancer, talk with your health care provider about genetic screening.  If you are at high risk for breast cancer, talk with your health care provider about having an MRI and a mammogram every year.  Breast cancer (BRCA) gene test is recommended for women who have family members with BRCA-related cancers. Results of the assessment will determine the need for genetic counseling and BRCA1 and for BRCA2 testing. BRCA-related cancers include these types: ? Breast. This occurs in males or females. ? Ovarian. ? Tubal. This may also be called fallopian tube cancer. ? Cancer of the abdominal or pelvic lining (peritoneal cancer). ? Prostate. ? Pancreatic.  Cervical, Uterine, and Ovarian Cancer Your health care provider may recommend that you be screened regularly for cancer of the pelvic organs. These include your ovaries, uterus, and vagina. This screening involves a pelvic exam, which includes checking for microscopic changes to the surface of your cervix (Pap test).  For women ages 21-65, health care providers may recommend a pelvic exam and a Pap test every three years. For women ages 79-65, they may recommend the Pap test and pelvic exam, combined with testing for human papilloma virus (HPV), every five years. Some types of HPV increase your risk of cervical cancer. Testing for HPV may also be done on women of any age who have unclear Pap test results.  Other health care providers may not recommend any screening for nonpregnant women who are considered low risk for pelvic cancer and have no symptoms. Ask your health care provider if a screening pelvic exam is right for you.  If you have had past treatment for cervical cancer or a condition that could lead to cancer, you need Pap tests and screening for cancer for at least 20 years after your treatment. If Pap tests have been discontinued for you, your risk factors (such as having a new sexual partner) need to be  reassessed to determine if you should start having screenings again. Some women have medical problems that increase the chance of getting cervical cancer. In these cases, your health care provider may recommend that you have screening and Pap tests more often.  If you have a family history of uterine cancer or ovarian cancer, talk with your health care provider about genetic screening.  If you have vaginal bleeding after reaching menopause, tell your health care provider.  There are currently no reliable tests available to screen for ovarian cancer.  Lung Cancer Lung cancer screening is recommended for adults 69-62 years old who are at high risk for lung cancer because of a history of smoking. A yearly low-dose CT scan of the lungs is recommended if you:  Currently smoke.  Have a history of at least 30 pack-years of smoking and you currently smoke or have quit within the past 15 years. A pack-year is smoking an average of one pack of cigarettes per day for one year.  Yearly screening should:  Continue until it has been 15 years since you quit.  Stop if you develop a health problem that would prevent you from having lung cancer treatment.  Colorectal Cancer  This type of cancer can be detected and can often be prevented.  Routine colorectal cancer screening usually begins at  age 42 and continues through age 45.  If you have risk factors for colon cancer, your health care provider may recommend that you be screened at an earlier age.  If you have a family history of colorectal cancer, talk with your health care provider about genetic screening.  Your health care provider may also recommend using home test kits to check for hidden blood in your stool.  A small camera at the end of a tube can be used to examine your colon directly (sigmoidoscopy or colonoscopy). This is done to check for the earliest forms of colorectal cancer.  Direct examination of the colon should be repeated every  5-10 years until age 71. However, if early forms of precancerous polyps or small growths are found or if you have a family history or genetic risk for colorectal cancer, you may need to be screened more often.  Skin Cancer  Check your skin from head to toe regularly.  Monitor any moles. Be sure to tell your health care provider: ? About any new moles or changes in moles, especially if there is a change in a mole's shape or color. ? If you have a mole that is larger than the size of a pencil eraser.  If any of your family members has a history of skin cancer, especially at a young age, talk with your health care provider about genetic screening.  Always use sunscreen. Apply sunscreen liberally and repeatedly throughout the day.  Whenever you are outside, protect yourself by wearing long sleeves, pants, a wide-brimmed hat, and sunglasses.  What should I know about osteoporosis? Osteoporosis is a condition in which bone destruction happens more quickly than new bone creation. After menopause, you may be at an increased risk for osteoporosis. To help prevent osteoporosis or the bone fractures that can happen because of osteoporosis, the following is recommended:  If you are 46-71 years old, get at least 1,000 mg of calcium and at least 600 mg of vitamin D per day.  If you are older than age 55 but younger than age 65, get at least 1,200 mg of calcium and at least 600 mg of vitamin D per day.  If you are older than age 54, get at least 1,200 mg of calcium and at least 800 mg of vitamin D per day.  Smoking and excessive alcohol intake increase the risk of osteoporosis. Eat foods that are rich in calcium and vitamin D, and do weight-bearing exercises several times each week as directed by your health care provider. What should I know about how menopause affects my mental health? Depression may occur at any age, but it is more common as you become older. Common symptoms of depression  include:  Low or sad mood.  Changes in sleep patterns.  Changes in appetite or eating patterns.  Feeling an overall lack of motivation or enjoyment of activities that you previously enjoyed.  Frequent crying spells.  Talk with your health care provider if you think that you are experiencing depression. What should I know about immunizations? It is important that you get and maintain your immunizations. These include:  Tetanus, diphtheria, and pertussis (Tdap) booster vaccine.  Influenza every year before the flu season begins.  Pneumonia vaccine.  Shingles vaccine.  Your health care provider may also recommend other immunizations. This information is not intended to replace advice given to you by your health care provider. Make sure you discuss any questions you have with your health care provider. Document Released: 07/06/2005  Document Revised: 12/02/2015 Document Reviewed: 02/15/2015 Elsevier Interactive Patient Education  2018 [employer]  

## 2017-04-30 NOTE — Progress Notes (Signed)
Subjective:    Patient ID: Veronica Leach, female    DOB: 23-Mar-1966, 51 y.o.   MRN: [id number]  HPI  Pt presents to the clinic today for her annual exam.  Anxiety: Triggered by having to care for her elderly parents. Stable on prn Xanax. She reports she takes 1/2 tablet weekly or every other week.  Migraines: She reports she doesn't really every have migraines being on the daily Propanolol.  Flu: 03/11/2017 Tetanus: < 10 years ago Pap Smear: 04/2016 Mammogram: 05/2016 Colon Screening: 05/2016 Vision Screening: yearly Dentist: biannually  Diet: She does eat meat. She consumes fruits and veggies daily. She avoids fried foods. She drinks mostly water. Exercise: She exercises for at least 30 minutes 3-5 days per week  Review of Systems      Past Medical History:  Diagnosis Date  . Anxiety   . Chicken pox   . History of migraine     Current Outpatient Medications  Medication Sig Dispense Refill  . ALPRAZolam (XANAX) 0.25 MG tablet Take 0.5 tablets (0.125 mg total) by mouth at bedtime as needed for anxiety. 30 tablet 0  . propranolol ER (INDERAL LA) 60 MG 24 hr capsule TAKE 1 CAPSULE BY MOUTH  DAILY 90 capsule 3  . conjugated estrogens (PREMARIN) vaginal cream 0.5gm daily x 21 days, then off x 7 days (Patient not taking: Reported on 04/30/2017) 60 g 1   No current facility-administered medications for this visit.     No Known Allergies  Family History  Problem Relation Age of Onset  . Arthritis Mother   . Arthritis Father   . Breast cancer Maternal Aunt 80    Social History   Socioeconomic History  . Marital status: Married    Spouse name: Not on file  . Number of children: Not on file  . Years of education: Not on file  . Highest education level: Not on file  Social Needs  . Financial resource strain: Not on file  . Food insecurity - worry: Not on file  . Food insecurity - inability: Not on file  . Transportation needs - medical: Not on file  . Transportation  needs - non-medical: Not on file  Occupational History  . Not on file  Tobacco Use  . Smoking status: Never Smoker  . Smokeless tobacco: Never Used  Substance and Sexual Activity  . Alcohol use: Yes    Alcohol/week: 0.0 oz    Comment: occasional  . Drug use: No  . Sexual activity: Yes  Other Topics Concern  . Not on file  Social History Narrative  . Not on file     Constitutional: Denies fever, malaise, fatigue, headache or abrupt weight changes.  HEENT: Denies eye pain, eye redness, ear pain, ringing in the ears, wax buildup, runny nose, nasal congestion, bloody nose, or sore throat. Respiratory: Denies difficulty breathing, shortness of breath, cough or sputum production.   Cardiovascular: Denies chest pain, chest tightness, palpitations or swelling in the hands or feet.  Gastrointestinal: Denies abdominal pain, bloating, constipation, diarrhea or blood in the stool.  GU: Denies urgency, frequency, pain with urination, burning sensation, blood in urine, odor or discharge. Musculoskeletal: Denies decrease in range of motion, difficulty with gait, muscle pain or joint pain and swelling.  Skin: Denies redness, rashes, lesions or ulcercations.  Neurological: Denies dizziness, difficulty with memory, difficulty with speech or problems with balance and coordination.  Psych: Pt reports anxiety. Denies depression, SI/HI.  No other specific complaints in a complete  review of systems (except as listed in HPI above).  Objective:   Physical Exam  BP 102/64   Pulse 62   Temp 97.8 F (36.6 C) (Oral)   Ht 5' 2.33" (1.583 m)   Wt 126 lb (57.2 kg)   LMP 10/26/2012   SpO2 98%   BMI 22.80 kg/m  Wt Readings from Last 3 Encounters:  04/30/17 126 lb (57.2 kg)  05/15/16 126 lb (57.2 kg)  04/28/15 126 lb (57.2 kg)    General: Appears her stated age, well developed, well nourished in NAD. Skin: Warm, dry and intact.  HEENT: Head: normal shape and size; Eyes: sclera white, no icterus,  conjunctiva pink, PERRLA and EOMs intact; Ears: Tm's gray and intact, normal light reflex; Throat/Mouth: Teeth present, mucosa pink and moist, no exudate, lesions or ulcerations noted.  Neck:  Neck supple, trachea midline. No masses, lumps or thyromegaly present.  Cardiovascular: Normal rate and rhythm. S1,S2 noted.  No murmur, rubs or gallops noted. No JVD or BLE edema. No carotid bruits noted. Pulmonary/Chest: Normal effort and positive vesicular breath sounds. No respiratory distress. No wheezes, rales or ronchi noted.  Abdomen: Soft and nontender. Normal bowel sounds. No distention or masses noted. Liver, spleen and kidneys non palpable. Pelvic: Normal female anatomy. Cervix not visualized. Adnexa non palpable.  Musculoskeletal: Strength 5/5 BUE/BLE. No difficulty with gait.  Neurological: Alert and oriented. Cranial nerves II-XII grossly intact. Coordination normal.  Psychiatric: Mood and affect normal. Behavior is normal. Judgment and thought content normal.    BMET    Component Value Date/Time   NA 138 05/15/2016 1557   K 4.2 05/15/2016 1557   CL 100 05/15/2016 1557   CO2 31 05/15/2016 1557   GLUCOSE 90 05/15/2016 1557   BUN 16 05/15/2016 1557   CREATININE 0.84 05/15/2016 1557   CALCIUM 10.0 05/15/2016 1557    Lipid Panel     Component Value Date/Time   CHOL 168 05/15/2016 1557   TRIG 127.0 05/15/2016 1557   HDL 65.70 05/15/2016 1557   CHOLHDL 3 05/15/2016 1557   VLDL 25.4 05/15/2016 1557   LDLCALC 77 05/15/2016 1557    CBC    Component Value Date/Time   WBC 6.6 05/15/2016 1557   RBC 4.49 05/15/2016 1557   HGB 13.3 05/15/2016 1557   HCT 39.2 05/15/2016 1557   PLT 297.0 05/15/2016 1557   MCV 87.3 05/15/2016 1557   MCHC 33.9 05/15/2016 1557   RDW 12.8 05/15/2016 1557    Hgb A1C No results found for: HGBA1C          Assessment & Plan:   Preventative Health Maintenance:  Flu and tetanus UTD Pap smear today- she declines STD screening Mammogram  UTD Colon screening UTD Encouraged her to consume a balanced diet and exercise regimen Advised her to see an eye doctor and dentist annually Will check CBC, CMET, Lipid and Vit D today  RTC in 1 year, sooner if needed Webb Silversmith, NP

## 2017-04-30 NOTE — Assessment & Plan Note (Signed)
Stable on prn Xanax Will get CSA and UDS at next visit

## 2017-05-02 LAB — CYTOLOGY - PAP: DIAGNOSIS: NEGATIVE

## 2017-06-03 ENCOUNTER — Other Ambulatory Visit: Payer: Self-pay | Admitting: Internal Medicine

## 2017-06-05 ENCOUNTER — Other Ambulatory Visit: Payer: Self-pay | Admitting: Internal Medicine

## 2017-06-05 DIAGNOSIS — Z1231 Encounter for screening mammogram for malignant neoplasm of breast: Secondary | ICD-10-CM

## 2017-06-25 ENCOUNTER — Ambulatory Visit
Admission: RE | Admit: 2017-06-25 | Discharge: 2017-06-25 | Disposition: A | Discharge status: Home / Self Care | Payer: Commercial Managed Care - PPO | Source: Ambulatory Visit | Attending: Internal Medicine | Admitting: Internal Medicine

## 2017-06-25 DIAGNOSIS — Z1231 Encounter for screening mammogram for malignant neoplasm of breast: Secondary | ICD-10-CM | POA: Insufficient documentation

## 2018-02-13 ENCOUNTER — Other Ambulatory Visit: Payer: Self-pay | Admitting: Internal Medicine

## 2018-02-13 DIAGNOSIS — F411 Generalized anxiety disorder: Secondary | ICD-10-CM

## 2018-02-13 MED ORDER — ALPRAZOLAM 0.25 MG PO TABS: 0.1250 mg | ORAL_TABLET | Freq: Every evening | ORAL | 0 refills | Status: DC | PRN

## 2018-02-13 NOTE — Telephone Encounter (Signed)
Last filled 03/08/17, last OV note from 04/2017 for CPE states pt takes only PRN... Please advise

## 2018-04-10 ENCOUNTER — Other Ambulatory Visit: Payer: Self-pay | Admitting: Internal Medicine

## 2018-05-13 ENCOUNTER — Encounter: Payer: Self-pay | Admitting: Internal Medicine

## 2018-05-13 ENCOUNTER — Ambulatory Visit (INDEPENDENT_AMBULATORY_CARE_PROVIDER_SITE_OTHER): Payer: 59 | Admitting: Internal Medicine

## 2018-05-13 VITALS — BP 104/70 | HR 55 | Temp 97.9°F | Ht 62.5 in | Wt 128.0 lb

## 2018-05-13 DIAGNOSIS — G43919 Migraine, unspecified, intractable, without status migrainosus: Secondary | ICD-10-CM

## 2018-05-13 DIAGNOSIS — Z1159 Encounter for screening for other viral diseases: Secondary | ICD-10-CM | POA: Diagnosis not present

## 2018-05-13 DIAGNOSIS — Z114 Encounter for screening for human immunodeficiency virus [HIV]: Secondary | ICD-10-CM

## 2018-05-13 DIAGNOSIS — Z0000 Encounter for general adult medical examination without abnormal findings: Secondary | ICD-10-CM | POA: Diagnosis not present

## 2018-05-13 DIAGNOSIS — F411 Generalized anxiety disorder: Secondary | ICD-10-CM

## 2018-05-13 DIAGNOSIS — Z79899 Other long term (current) drug therapy: Secondary | ICD-10-CM

## 2018-05-13 LAB — COMPREHENSIVE METABOLIC PANEL
ALK PHOS: 70 U/L (ref 39–117)
ALT: 15 U/L (ref 0–35)
AST: 18 U/L (ref 0–37)
Albumin: 4.8 g/dL (ref 3.5–5.2)
BILIRUBIN TOTAL: 0.5 mg/dL (ref 0.2–1.2)
BUN: 17 mg/dL (ref 6–23)
CALCIUM: 10.1 mg/dL (ref 8.4–10.5)
CO2: 30 mEq/L (ref 19–32)
Chloride: 100 mEq/L (ref 96–112)
Creatinine, Ser: 0.77 mg/dL (ref 0.40–1.20)
GFR: 82.96 mL/min (ref >60.00)
GLUCOSE: 95 mg/dL (ref 70–99)
Potassium: 4.4 mEq/L (ref 3.5–5.1)
Sodium: 138 mEq/L (ref 135–145)
TOTAL PROTEIN: 7.6 g/dL (ref 6.0–8.3)

## 2018-05-13 LAB — VITAMIN D 25 HYDROXY (VIT D DEFICIENCY, FRACTURES): VITD: 36.3 ng/mL (ref 30.00–100.00)

## 2018-05-13 LAB — LIPID PANEL
Cholesterol: 178 mg/dL (ref 0–200)
HDL: 70.7 mg/dL (ref >39.00)
LDL Cholesterol: 92 mg/dL (ref 0–99)
NONHDL: 106.92
TRIGLYCERIDES: 74 mg/dL (ref 0.0–149.0)
Total CHOL/HDL Ratio: 3
VLDL: 14.8 mg/dL (ref 0.0–40.0)

## 2018-05-13 LAB — CBC
HCT: 39.9 % (ref 36.0–46.0)
Hemoglobin: 13.5 g/dL (ref 12.0–15.0)
MCHC: 33.8 g/dL (ref 30.0–36.0)
MCV: 88 fl (ref 78.0–100.0)
PLATELETS: 302 10*3/uL (ref 150.0–400.0)
RBC: 4.53 Mil/uL (ref 3.87–5.11)
RDW: 13.1 % (ref 11.5–15.5)
WBC: 6 10*3/uL (ref 4.0–10.5)

## 2018-05-13 NOTE — Assessment & Plan Note (Signed)
Controlled with PRN Xanax Support offered today Will monitor

## 2018-05-13 NOTE — Assessment & Plan Note (Signed)
Controlled on Propanolol Will monitor

## 2018-05-13 NOTE — Progress Notes (Signed)
Subjective:    Patient ID: Veronica Leach, female    DOB: [birth year], 52 y.o.   MRN: [id number]  HPI  Pt presents to the clinic today for her annual exam. She is also due to follow up chronic conditions.  Frequent Headaches: Controlled on Propanolol. She never really has breakthrough headaches.   Anxiety: Triggered by situational stress, being a caregiveer, etc. Her symptoms are controlled on Xanax prn. There is not CSA/UDS on file.  Flu: 02/2018 Tetanus: < 10 years ago Pap Smear: 04/2017 Mammogram: 05/2017 Colon Screening: 05/2016 Vision Screening: annually Dentist: biannually  Diet: She does eat meat. She consumes fruits and veggies daily. She tries to avoid fried foods. She drinks mostly water. Exercise: Pilates, walking  Review of Systems  Past Medical History:  Diagnosis Date  . Anxiety   . Chicken pox   . History of migraine     Current Outpatient Medications  Medication Sig Dispense Refill  . ALPRAZolam (XANAX) 0.25 MG tablet Take 0.5 tablets (0.125 mg total) by mouth at bedtime as needed for anxiety. 30 tablet 0  . conjugated estrogens (PREMARIN) vaginal cream 0.5gm daily x 21 days, then off x 7 days (Patient not taking: Reported on 04/30/2017) 60 g 1  . propranolol ER (INDERAL LA) 60 MG 24 hr capsule Take 1 capsule (60 mg total) by mouth daily. MUST SCHEDULE ANNUAL PHYSICAL 90 capsule 0   No current facility-administered medications for this visit.     No Known Allergies  Family History  Problem Relation Age of Onset  . Arthritis Mother   . Arthritis Father   . Breast cancer Maternal Aunt 80    Social History   Socioeconomic History  . Marital status: Married    Spouse name: Not on file  . Number of children: Not on file  . Years of education: Not on file  . Highest education level: Not on file  Occupational History  . Not on file  Social Needs  . Financial resource strain: Not on file  . Food insecurity:    Worry: Not on file    Inability: Not on  file  . Transportation needs:    Medical: Not on file    Non-medical: Not on file  Tobacco Use  . Smoking status: Never Smoker  . Smokeless tobacco: Never Used  Substance and Sexual Activity  . Alcohol use: Yes    Alcohol/week: 0.0 standard drinks    Comment: occasional  . Drug use: No  . Sexual activity: Yes  Lifestyle  . Physical activity:    Days per week: Not on file    Minutes per session: Not on file  . Stress: Not on file  Relationships  . Social connections:    Talks on phone: Not on file    Gets together: Not on file    Attends religious service: Not on file    Active member of club or organization: Not on file    Attends meetings of clubs or organizations: Not on file    Relationship status: Not on file  . Intimate partner violence:    Fear of current or ex partner: Not on file    Emotionally abused: Not on file    Physically abused: Not on file    Forced sexual activity: Not on file  Other Topics Concern  . Not on file  Social History Narrative  . Not on file     Constitutional: Denies fever, malaise, fatigue, headache or abrupt weight changes.  HEENT: Denies eye pain, eye redness, ear pain, ringing in the ears, wax buildup, runny nose, nasal congestion, bloody nose, or sore throat. Respiratory: Denies difficulty breathing, shortness of breath, cough or sputum production.   Cardiovascular: Denies chest pain, chest tightness, palpitations or swelling in the hands or feet.  Gastrointestinal: Denies abdominal pain, bloating, constipation, diarrhea or blood in the stool.  GU: Denies urgency, frequency, pain with urination, burning sensation, blood in urine, odor or discharge. Musculoskeletal: Denies decrease in range of motion, difficulty with gait, muscle pain or joint pain and swelling.  Skin: Denies redness, rashes, lesions or ulcercations.  Neurological: Denies dizziness, difficulty with memory, difficulty with speech or problems with balance and coordination.   Psych: Pt reports intermittent anxiety. Denies depression, SI/HI.  No other specific complaints in a complete review of systems (except as listed in HPI above).     Objective:   Physical Exam Ht 5' 2.5" (1.588 m)   Wt 128 lb (58.1 kg)   LMP 10/26/2012   BMI 23.04 kg/m  Wt Readings from Last 3 Encounters:  05/13/18 128 lb (58.1 kg)  04/30/17 126 lb (57.2 kg)  05/15/16 126 lb (57.2 kg)    General: Appears her stated age, well developed, well nourished in NAD. Skin: Warm, dry and intact.  HEENT: Head: normal shape and size; Eyes: sclera white, no icterus, conjunctiva pink, PERRLA and EOMs intact; Ears: Tm's gray and intact, normal light reflex; Throat/Mouth: Teeth present, mucosa pink and moist, no exudate, lesions or ulcerations noted.  Neck:  Neck supple, trachea midline. No masses, lumps or thyromegaly present.  Cardiovascular: Normal rate and rhythm. S1,S2 noted.  No murmur, rubs or gallops noted. No JVD or BLE edema. No carotid bruits noted. Pulmonary/Chest: Normal effort and positive vesicular breath sounds. No respiratory distress. No wheezes, rales or ronchi noted.  Abdomen: Soft and nontender. Normal bowel sounds. No distention or masses noted. Liver, spleen and kidneys non palpable. Musculoskeletal: Strength 5/5 BUE/BLE. No difficulty with gait.  Neurological: Alert and oriented. Cranial nerves II-XII grossly intact. Coordination normal.  Psychiatric: Mood and affect normal. Behavior is normal. Judgment and thought content normal.     BMET    Component Value Date/Time   NA 137 04/30/2017 1251   K 4.2 04/30/2017 1251   CL 100 04/30/2017 1251   CO2 32 04/30/2017 1251   GLUCOSE 93 04/30/2017 1251   BUN 15 04/30/2017 1251   CREATININE 0.79 04/30/2017 1251   CALCIUM 9.9 04/30/2017 1251    Lipid Panel     Component Value Date/Time   CHOL 176 04/30/2017 1251   TRIG 99.0 04/30/2017 1251   HDL 67.00 04/30/2017 1251   CHOLHDL 3 04/30/2017 1251   VLDL 19.8 04/30/2017  1251   LDLCALC 90 04/30/2017 1251    CBC    Component Value Date/Time   WBC 5.4 04/30/2017 1251   RBC 4.46 04/30/2017 1251   HGB 13.4 04/30/2017 1251   HCT 39.9 04/30/2017 1251   PLT 296.0 04/30/2017 1251   MCV 89.5 04/30/2017 1251   MCHC 33.5 04/30/2017 1251   RDW 13.0 04/30/2017 1251    Hgb A1C No results found for: HGBA1C          Assessment & Plan:   Preventative Health Maintenance:  Flu shot UTD Tetanus UTD per her report Pap smear UTD Mammogram due 05/2018 Colon screening UTD Encouraged her to consume a balanced diet and exercise regimen Advised her to see an eye doctor and dentist annually Will check  CBC, CMET, Lipid, Vit D, HIV and Hep C today  RTC in 1 year, sooner if needed Webb Silversmith, NP

## 2018-05-13 NOTE — Patient Instructions (Signed)
Health Maintenance for Postmenopausal Women Menopause is a normal process in which your reproductive ability comes to an end. This process happens gradually over a span of months to years, usually between the ages of 22 and 9. Menopause is complete when you have missed 12 consecutive menstrual periods. It is important to talk with your health care provider about some of the most common conditions that affect postmenopausal women, such as heart disease, cancer, and bone loss (osteoporosis). Adopting a healthy lifestyle and getting preventive care can help to promote your health and wellness. Those actions can also lower your chances of developing some of these common conditions. What should I know about menopause? During menopause, you may experience a number of symptoms, such as:  Moderate-to-severe hot flashes.  Night sweats.  Decrease in sex drive.  Mood swings.  Headaches.  Tiredness.  Irritability.  Memory problems.  Insomnia.  Choosing to treat or not to treat menopausal changes is an individual decision that you make with your health care provider. What should I know about hormone replacement therapy and supplements? Hormone therapy products are effective for treating symptoms that are associated with menopause, such as hot flashes and night sweats. Hormone replacement carries certain risks, especially as you become older. If you are thinking about using estrogen or estrogen with progestin treatments, discuss the benefits and risks with your health care provider. What should I know about heart disease and stroke? Heart disease, heart attack, and stroke become more likely as you age. This may be due, in part, to the hormonal changes that your body experiences during menopause. These can affect how your body processes dietary fats, triglycerides, and cholesterol. Heart attack and stroke are both medical emergencies. There are many things that you can do to help prevent heart disease  and stroke:  Have your blood pressure checked at least every 1-2 years. High blood pressure causes heart disease and increases the risk of stroke.  If you are 53-22 years old, ask your health care provider if you should take aspirin to prevent a heart attack or a stroke.  Do not use any tobacco products, including cigarettes, chewing tobacco, or electronic cigarettes. If you need help quitting, ask your health care provider.  It is important to eat a healthy diet and maintain a healthy weight. ? Be sure to include plenty of vegetables, fruits, low-fat dairy products, and lean protein. ? Avoid eating foods that are high in solid fats, added sugars, or salt (sodium).  Get regular exercise. This is one of the most important things that you can do for your health. ? Try to exercise for at least 150 minutes each week. The type of exercise that you do should increase your heart rate and make you sweat. This is known as moderate-intensity exercise. ? Try to do strengthening exercises at least twice each week. Do these in addition to the moderate-intensity exercise.  Know your numbers.Ask your health care provider to check your cholesterol and your blood glucose. Continue to have your blood tested as directed by your health care provider.  What should I know about cancer screening? There are several types of cancer. Take the following steps to reduce your risk and to catch any cancer development as early as possible. Breast Cancer  Practice breast self-awareness. ? This means understanding how your breasts normally appear and feel. ? It also means doing regular breast self-exams. Let your health care provider know about any changes, no matter how small.  If you are 40  or older, have a clinician do a breast exam (clinical breast exam or CBE) every year. Depending on your age, family history, and medical history, it may be recommended that you also have a yearly breast X-ray (mammogram).  If you  have a family history of breast cancer, talk with your health care provider about genetic screening.  If you are at high risk for breast cancer, talk with your health care provider about having an MRI and a mammogram every year.  Breast cancer (BRCA) gene test is recommended for women who have family members with BRCA-related cancers. Results of the assessment will determine the need for genetic counseling and BRCA1 and for BRCA2 testing. BRCA-related cancers include these types: ? Breast. This occurs in males or females. ? Ovarian. ? Tubal. This may also be called fallopian tube cancer. ? Cancer of the abdominal or pelvic lining (peritoneal cancer). ? Prostate. ? Pancreatic.  Cervical, Uterine, and Ovarian Cancer Your health care provider may recommend that you be screened regularly for cancer of the pelvic organs. These include your ovaries, uterus, and vagina. This screening involves a pelvic exam, which includes checking for microscopic changes to the surface of your cervix (Pap test).  For women ages 21-65, health care providers may recommend a pelvic exam and a Pap test every three years. For women ages 79-65, they may recommend the Pap test and pelvic exam, combined with testing for human papilloma virus (HPV), every five years. Some types of HPV increase your risk of cervical cancer. Testing for HPV may also be done on women of any age who have unclear Pap test results.  Other health care providers may not recommend any screening for nonpregnant women who are considered low risk for pelvic cancer and have no symptoms. Ask your health care provider if a screening pelvic exam is right for you.  If you have had past treatment for cervical cancer or a condition that could lead to cancer, you need Pap tests and screening for cancer for at least 20 years after your treatment. If Pap tests have been discontinued for you, your risk factors (such as having a new sexual partner) need to be  reassessed to determine if you should start having screenings again. Some women have medical problems that increase the chance of getting cervical cancer. In these cases, your health care provider may recommend that you have screening and Pap tests more often.  If you have a family history of uterine cancer or ovarian cancer, talk with your health care provider about genetic screening.  If you have vaginal bleeding after reaching menopause, tell your health care provider.  There are currently no reliable tests available to screen for ovarian cancer.  Lung Cancer Lung cancer screening is recommended for adults 69-62 years old who are at high risk for lung cancer because of a history of smoking. A yearly low-dose CT scan of the lungs is recommended if you:  Currently smoke.  Have a history of at least 30 pack-years of smoking and you currently smoke or have quit within the past 15 years. A pack-year is smoking an average of one pack of cigarettes per day for one year.  Yearly screening should:  Continue until it has been 15 years since you quit.  Stop if you develop a health problem that would prevent you from having lung cancer treatment.  Colorectal Cancer  This type of cancer can be detected and can often be prevented.  Routine colorectal cancer screening usually begins at  age 42 and continues through age 45.  If you have risk factors for colon cancer, your health care provider may recommend that you be screened at an earlier age.  If you have a family history of colorectal cancer, talk with your health care provider about genetic screening.  Your health care provider may also recommend using home test kits to check for hidden blood in your stool.  A small camera at the end of a tube can be used to examine your colon directly (sigmoidoscopy or colonoscopy). This is done to check for the earliest forms of colorectal cancer.  Direct examination of the colon should be repeated every  5-10 years until age 71. However, if early forms of precancerous polyps or small growths are found or if you have a family history or genetic risk for colorectal cancer, you may need to be screened more often.  Skin Cancer  Check your skin from head to toe regularly.  Monitor any moles. Be sure to tell your health care provider: ? About any new moles or changes in moles, especially if there is a change in a mole's shape or color. ? If you have a mole that is larger than the size of a pencil eraser.  If any of your family members has a history of skin cancer, especially at a young age, talk with your health care provider about genetic screening.  Always use sunscreen. Apply sunscreen liberally and repeatedly throughout the day.  Whenever you are outside, protect yourself by wearing long sleeves, pants, a wide-brimmed hat, and sunglasses.  What should I know about osteoporosis? Osteoporosis is a condition in which bone destruction happens more quickly than new bone creation. After menopause, you may be at an increased risk for osteoporosis. To help prevent osteoporosis or the bone fractures that can happen because of osteoporosis, the following is recommended:  If you are 46-71 years old, get at least 1,000 mg of calcium and at least 600 mg of vitamin D per day.  If you are older than age 55 but younger than age 65, get at least 1,200 mg of calcium and at least 600 mg of vitamin D per day.  If you are older than age 54, get at least 1,200 mg of calcium and at least 800 mg of vitamin D per day.  Smoking and excessive alcohol intake increase the risk of osteoporosis. Eat foods that are rich in calcium and vitamin D, and do weight-bearing exercises several times each week as directed by your health care provider. What should I know about how menopause affects my mental health? Depression may occur at any age, but it is more common as you become older. Common symptoms of depression  include:  Low or sad mood.  Changes in sleep patterns.  Changes in appetite or eating patterns.  Feeling an overall lack of motivation or enjoyment of activities that you previously enjoyed.  Frequent crying spells.  Talk with your health care provider if you think that you are experiencing depression. What should I know about immunizations? It is important that you get and maintain your immunizations. These include:  Tetanus, diphtheria, and pertussis (Tdap) booster vaccine.  Influenza every year before the flu season begins.  Pneumonia vaccine.  Shingles vaccine.  Your health care provider may also recommend other immunizations. This information is not intended to replace advice given to you by your health care provider. Make sure you discuss any questions you have with your health care provider. Document Released: 07/06/2005  Document Revised: 12/02/2015 Document Reviewed: 02/15/2015 Elsevier Interactive Patient Education  2018 [employer]  

## 2018-05-16 LAB — HEPATITIS C ANTIBODY
HEP C AB: NONREACTIVE
SIGNAL TO CUT-OFF: 0.02 (ref <1.00)

## 2018-05-16 LAB — PAIN MGMT, PROFILE 8 W/CONF, U
6 Acetylmorphine: NEGATIVE ng/mL (ref <10)
AMPHETAMINES: NEGATIVE ng/mL (ref <500)
Alcohol Metabolites: NEGATIVE ng/mL (ref <500)
Benzodiazepines: NEGATIVE ng/mL (ref <100)
Buprenorphine, Urine: NEGATIVE ng/mL (ref <5)
Cocaine Metabolite: NEGATIVE ng/mL (ref <150)
Creatinine: 55.7 mg/dL
Ethyl Glucuronide (ETG): NEGATIVE ng/mL (ref <500)
Ethyl Sulfate (ETS): NEGATIVE ng/mL (ref <100)
MDMA: NEGATIVE ng/mL (ref <500)
Marijuana Metabolite: NEGATIVE ng/mL (ref <20)
Opiates: NEGATIVE ng/mL (ref <100)
Oxidant: NEGATIVE ug/mL (ref <200)
Oxycodone: NEGATIVE ng/mL (ref <100)
pH: 7 (ref 4.5–9.0)

## 2018-05-16 LAB — HIV ANTIBODY (ROUTINE TESTING W REFLEX): HIV 1&2 Ab, 4th Generation: NONREACTIVE

## 2018-06-02 ENCOUNTER — Other Ambulatory Visit: Payer: Self-pay | Admitting: Internal Medicine

## 2018-06-02 DIAGNOSIS — Z1231 Encounter for screening mammogram for malignant neoplasm of breast: Secondary | ICD-10-CM

## 2018-06-12 ENCOUNTER — Encounter: Payer: Commercial Managed Care - PPO | Admitting: Internal Medicine

## 2018-06-26 ENCOUNTER — Ambulatory Visit
Admission: RE | Admit: 2018-06-26 | Discharge: 2018-06-26 | Disposition: A | Discharge status: Home / Self Care | Payer: 59 | Source: Ambulatory Visit | Attending: Internal Medicine | Admitting: Internal Medicine

## 2018-06-26 DIAGNOSIS — Z1231 Encounter for screening mammogram for malignant neoplasm of breast: Secondary | ICD-10-CM | POA: Diagnosis present

## 2018-07-04 ENCOUNTER — Other Ambulatory Visit: Payer: Self-pay | Admitting: Internal Medicine

## 2019-02-16 ENCOUNTER — Other Ambulatory Visit: Payer: Self-pay | Admitting: Internal Medicine

## 2019-02-16 DIAGNOSIS — F411 Generalized anxiety disorder: Secondary | ICD-10-CM

## 2019-02-16 NOTE — Telephone Encounter (Signed)
Last filled 02/14/2019... please advise

## 2019-02-17 MED ORDER — ALPRAZOLAM 0.25 MG PO TABS: 0.1250 mg | ORAL_TABLET | Freq: Every evening | ORAL | 0 refills | Status: DC | PRN

## 2019-03-17 ENCOUNTER — Other Ambulatory Visit: Payer: Self-pay | Admitting: Internal Medicine

## 2019-05-25 ENCOUNTER — Encounter: Payer: 59 | Admitting: Internal Medicine

## 2019-06-06 ENCOUNTER — Other Ambulatory Visit: Payer: Self-pay | Admitting: Internal Medicine

## 2019-06-25 ENCOUNTER — Other Ambulatory Visit: Payer: Self-pay

## 2019-06-25 ENCOUNTER — Ambulatory Visit (INDEPENDENT_AMBULATORY_CARE_PROVIDER_SITE_OTHER): Payer: Commercial Managed Care - PPO | Admitting: Internal Medicine

## 2019-06-25 ENCOUNTER — Encounter: Payer: Self-pay | Admitting: Internal Medicine

## 2019-06-25 VITALS — BP 102/74 | HR 66 | Temp 97.7°F | Ht 62.5 in | Wt 123.0 lb

## 2019-06-25 DIAGNOSIS — Z1211 Encounter for screening for malignant neoplasm of colon: Secondary | ICD-10-CM

## 2019-06-25 DIAGNOSIS — Z79899 Other long term (current) drug therapy: Secondary | ICD-10-CM | POA: Diagnosis not present

## 2019-06-25 DIAGNOSIS — G43919 Migraine, unspecified, intractable, without status migrainosus: Secondary | ICD-10-CM

## 2019-06-25 DIAGNOSIS — Z0000 Encounter for general adult medical examination without abnormal findings: Secondary | ICD-10-CM | POA: Diagnosis not present

## 2019-06-25 DIAGNOSIS — F411 Generalized anxiety disorder: Secondary | ICD-10-CM

## 2019-06-25 LAB — CBC
HCT: 39.2 % (ref 36.0–46.0)
Hemoglobin: 13.2 g/dL (ref 12.0–15.0)
MCHC: 33.8 g/dL (ref 30.0–36.0)
MCV: 88.8 fl (ref 78.0–100.0)
Platelets: 282 10*3/uL (ref 150.0–400.0)
RBC: 4.42 Mil/uL (ref 3.87–5.11)
RDW: 12.7 % (ref 11.5–15.5)
WBC: 5.9 10*3/uL (ref 4.0–10.5)

## 2019-06-25 LAB — COMPREHENSIVE METABOLIC PANEL
ALT: 12 U/L (ref 0–35)
AST: 17 U/L (ref 0–37)
Albumin: 4.7 g/dL (ref 3.5–5.2)
Alkaline Phosphatase: 70 U/L (ref 39–117)
BUN: 19 mg/dL (ref 6–23)
CO2: 32 mEq/L (ref 19–32)
Calcium: 10.1 mg/dL (ref 8.4–10.5)
Chloride: 100 mEq/L (ref 96–112)
Creatinine, Ser: 0.78 mg/dL (ref 0.40–1.20)
GFR: 76.59 mL/min (ref >60.00)
Glucose, Bld: 89 mg/dL (ref 70–99)
Potassium: 4.1 mEq/L (ref 3.5–5.1)
Sodium: 138 mEq/L (ref 135–145)
Total Bilirubin: 0.6 mg/dL (ref 0.2–1.2)
Total Protein: 7.5 g/dL (ref 6.0–8.3)

## 2019-06-25 LAB — LIPID PANEL
Cholesterol: 165 mg/dL (ref 0–200)
HDL: 65.9 mg/dL (ref >39.00)
LDL Cholesterol: 84 mg/dL (ref 0–99)
NonHDL: 99
Total CHOL/HDL Ratio: 3
Triglycerides: 74 mg/dL (ref 0.0–149.0)
VLDL: 14.8 mg/dL (ref 0.0–40.0)

## 2019-06-25 LAB — VITAMIN D 25 HYDROXY (VIT D DEFICIENCY, FRACTURES): VITD: 37.18 ng/mL (ref 30.00–100.00)

## 2019-06-25 NOTE — Assessment & Plan Note (Signed)
Controlled on Propanolol Will monitor

## 2019-06-25 NOTE — Assessment & Plan Note (Signed)
CSA and UDS Continue Xanax prn Support offered today

## 2019-06-25 NOTE — Progress Notes (Signed)
Subjective:    Patient ID: Veronica Leach, female    DOB: [birth year], 54 y.o.   MRN: [id number]  HPI  Pt presents to the clinic today for her annual exam. She is also due to follow up chronic conditions.  Migraines: She has only had 1 this year. She takes Propanolol as prescribed for preventative therapy. She is not following with neurology.  Anxiety: Triggered by stress involving her parents. She takes Xanax as needed, on average every other week. She is due for CSA and UDS today.  Flu: 02/2019 Tetanus: < 10 years ago per her report Pap Smear: 04/2017 Mammogram: 05/2018 Colon Screening: 05/2016 (cologuard) Vision Screening: annually Dentist: biannually  Diet: Eats meat, fruits, and veggies. She avoids fried foods. She drinks mostly water and some herbal tea.  Exercise: She walks 3-4 times per week, 3 miles each.   Review of Systems  Past Medical History:  Diagnosis Date  . Anxiety   . Chicken pox   . History of migraine     Current Outpatient Medications  Medication Sig Dispense Refill  . ALPRAZolam (XANAX) 0.25 MG tablet Take 0.5 tablets (0.125 mg total) by mouth at bedtime as needed for anxiety. 30 tablet 0  . propranolol ER (INDERAL LA) 60 MG 24 hr capsule TAKE 1 CAPSULE BY MOUTH  DAILY 90 capsule 0   No current facility-administered medications for this visit.    No Known Allergies  Family History  Problem Relation Age of Onset  . Arthritis Mother   . Arthritis Father   . Breast cancer Maternal Aunt 80    Social History   Socioeconomic History  . Marital status: Married    Spouse name: Not on file  . Number of children: Not on file  . Years of education: Not on file  . Highest education level: Not on file  Occupational History  . Not on file  Tobacco Use  . Smoking status: Never Smoker  . Smokeless tobacco: Never Used  Substance and Sexual Activity  . Alcohol use: Yes    Alcohol/week: 0.0 standard drinks    Comment: occasional  . Drug use: No  .  Sexual activity: Yes  Other Topics Concern  . Not on file  Social History Narrative  . Not on file   Social Determinants of Health   Financial Resource Strain:   . Difficulty of Paying Living Expenses: Not on file  Food Insecurity:   . Worried About Charity fundraiser in the Last Year: Not on file  . Ran Out of Food in the Last Year: Not on file  Transportation Needs:   . Lack of Transportation (Medical): Not on file  . Lack of Transportation (Non-Medical): Not on file  Physical Activity:   . Days of Exercise per Week: Not on file  . Minutes of Exercise per Session: Not on file  Stress:   . Feeling of Stress : Not on file  Social Connections:   . Frequency of Communication with Friends and Family: Not on file  . Frequency of Social Gatherings with Friends and Family: Not on file  . Attends Religious Services: Not on file  . Active Member of Clubs or Organizations: Not on file  . Attends Archivist Meetings: Not on file  . Marital Status: Not on file  Intimate Partner Violence:   . Fear of Current or Ex-Partner: Not on file  . Emotionally Abused: Not on file  . Physically Abused: Not on file  .  Sexually Abused: Not on file     Constitutional: She reports some fatigue lingering from COVID. Denies fever, malaise, headache or abrupt weight changes.  HEENT: Denies eye pain, eye redness, ear pain, ringing in the ears, wax buildup, runny nose, nasal congestion, bloody nose, or sore throat. Respiratory: Denies difficulty breathing, shortness of breath, cough or sputum production.   Cardiovascular: Denies chest pain, chest tightness, palpitations or swelling in the hands or feet.  Gastrointestinal: Denies abdominal pain, bloating, constipation, diarrhea or blood in the stool.   GU: Denies urgency, frequency, pain with urination, burning sensation, blood in urine, odor or discharge. Musculoskeletal: Denies decrease in range of motion, difficulty with gait, muscle pain or  joint pain and swelling.  Skin: Denies redness, rashes, lesions or ulcercations.  Neurological: Denies dizziness, difficulty with memory, difficulty with speech or problems with balance and coordination.  Psych: Pt has a history of anxiety. Denies depression, SI/HI.    No other specific complaints in a complete review of systems (except as listed in HPI above).     Objective:   Physical Exam   BP 102/74   Pulse 66   Temp 97.7 F (36.5 C) (Temporal)   Ht 5' 2.5" (1.588 m)   Wt 123 lb (55.8 kg)   LMP 10/26/2012   SpO2 98%   BMI 22.14 kg/m   Wt Readings from Last 3 Encounters:  05/13/18 128 lb (58.1 kg)  04/30/17 126 lb (57.2 kg)  05/15/16 126 lb (57.2 kg)    General: Appears her stated age, well developed, well nourished in NAD. Skin: Warm, dry and intact. No rashes, lesions or ulcerations noted. HEENT: Head: normal shape and size; Eyes: sclera white, no icterus, conjunctiva pink, PERRLA and EOMs intact. Cardiovascular: Normal rate and rhythm. S1,S2 noted.  No murmur, rubs or gallops noted. No JVD or BLE edema. No carotid bruits noted. Pulmonary/Chest: Normal effort and positive vesicular breath sounds. No respiratory distress. No wheezes, rales or ronchi noted.  Abdomen: Soft and nontender. Normal bowel sounds. No distention or masses noted. Liver, spleen and kidneys non palpable. Musculoskeletal: Strength 5/5 BUE/BLE. No difficulty with gait. Strength 5/5 BUE/BLE. Neurological: Alert and oriented. Cranial nerves II-XII grossly intact. Coordination normal.  Psychiatric: Mood and affect normal. Behavior is normal. Judgment and thought content normal.    BMET    Component Value Date/Time   NA 138 05/13/2018 1234   K 4.4 05/13/2018 1234   CL 100 05/13/2018 1234   CO2 30 05/13/2018 1234   GLUCOSE 95 05/13/2018 1234   BUN 17 05/13/2018 1234   CREATININE 0.77 05/13/2018 1234   CALCIUM 10.1 05/13/2018 1234    Lipid Panel     Component Value Date/Time   CHOL 178  05/13/2018 1234   TRIG 74.0 05/13/2018 1234   HDL 70.70 05/13/2018 1234   CHOLHDL 3 05/13/2018 1234   VLDL 14.8 05/13/2018 1234   LDLCALC 92 05/13/2018 1234    CBC    Component Value Date/Time   WBC 6.0 05/13/2018 1234   RBC 4.53 05/13/2018 1234   HGB 13.5 05/13/2018 1234   HCT 39.9 05/13/2018 1234   PLT 302.0 05/13/2018 1234   MCV 88.0 05/13/2018 1234   MCHC 33.8 05/13/2018 1234   RDW 13.1 05/13/2018 1234    Hgb A1C No results found for: HGBA1C         Assessment & Plan:   Preventative Health Maintenance:  Flu shot UTD Tetanus UTD Pap smear UTD Mammogram ordered, she will call to schedule  Cologuard ordered Encouraged her to consume a balanced diet and exercise regimen Advised her to see an eye doctor and dentist annually Will check CBC, CMET, Lipid and Vit D today  RTC in 1 year, sooner if needed Webb Silversmith, NP This visit occurred during the SARS-CoV-2 public health emergency.  Safety protocols were in place, including screening questions prior to the visit, additional usage of staff PPE, and extensive cleaning of exam room while observing appropriate contact time as indicated for disinfecting solutions.

## 2019-06-25 NOTE — Patient Instructions (Signed)
Health Maintenance, Female Adopting a healthy lifestyle and getting preventive care are important in promoting health and wellness. Ask your health care provider about:  The right schedule for you to have regular tests and exams.  Things you can do on your own to prevent diseases and keep yourself healthy. What should I know about diet, weight, and exercise? Eat a healthy diet   Eat a diet that includes plenty of vegetables, fruits, low-fat dairy products, and lean protein.  Do not eat a lot of foods that are high in solid fats, added sugars, or sodium. Maintain a healthy weight Body mass index (BMI) is used to identify weight problems. It estimates body fat based on height and weight. Your health care provider can help determine your BMI and help you achieve or maintain a healthy weight. Get regular exercise Get regular exercise. This is one of the most important things you can do for your health. Most adults should:  Exercise for at least 150 minutes each week. The exercise should increase your heart rate and make you sweat (moderate-intensity exercise).  Do strengthening exercises at least twice a week. This is in addition to the moderate-intensity exercise.  Spend less time sitting. Even light physical activity can be beneficial. Watch cholesterol and blood lipids Have your blood tested for lipids and cholesterol at 56 years of age, then have this test every 5 years. Have your cholesterol levels checked more often if:  Your lipid or cholesterol levels are high.  You are older than 56 years of age.  You are at high risk for heart disease. What should I know about cancer screening? Depending on your health history and family history, you may need to have cancer screening at various ages. This may include screening for:  Breast cancer.  Cervical cancer.  Colorectal cancer.  Skin cancer.  Lung cancer. What should I know about heart disease, diabetes, and high blood  pressure? Blood pressure and heart disease  High blood pressure causes heart disease and increases the risk of stroke. This is more likely to develop in people who have high blood pressure readings, are of African descent, or are overweight.  Have your blood pressure checked: ? Every 3-5 years if you are 18-39 years of age. ? Every year if you are 40 years old or older. Diabetes Have regular diabetes screenings. This checks your fasting blood sugar level. Have the screening done:  Once every three years after age 40 if you are at a normal weight and have a low risk for diabetes.  More often and at a younger age if you are overweight or have a high risk for diabetes. What should I know about preventing infection? Hepatitis B If you have a higher risk for hepatitis B, you should be screened for this virus. Talk with your health care provider to find out if you are at risk for hepatitis B infection. Hepatitis C Testing is recommended for:  Everyone born from 1945 through 1965.  Anyone with known risk factors for hepatitis C. Sexually transmitted infections (STIs)  Get screened for STIs, including gonorrhea and chlamydia, if: ? You are sexually active and are younger than 56 years of age. ? You are older than 56 years of age and your health care provider tells you that you are at risk for this type of infection. ? Your sexual activity has changed since you were last screened, and you are at increased risk for chlamydia or gonorrhea. Ask your health care provider if   you are at risk.  Ask your health care provider about whether you are at high risk for HIV. Your health care provider may recommend a prescription medicine to help prevent HIV infection. If you choose to take medicine to prevent HIV, you should first get tested for HIV. You should then be tested every 3 months for as long as you are taking the medicine. Pregnancy  If you are about to stop having your period (premenopausal) and  you may become pregnant, seek counseling before you get pregnant.  Take 400 to 800 micrograms (mcg) of folic acid every day if you become pregnant.  Ask for birth control (contraception) if you want to prevent pregnancy. Osteoporosis and menopause Osteoporosis is a disease in which the bones lose minerals and strength with aging. This can result in bone fractures. If you are 65 years old or older, or if you are at risk for osteoporosis and fractures, ask your health care provider if you should:  Be screened for bone loss.  Take a calcium or vitamin D supplement to lower your risk of fractures.  Be given hormone replacement therapy (HRT) to treat symptoms of menopause. Follow these instructions at home: Lifestyle  Do not use any products that contain nicotine or tobacco, such as cigarettes, e-cigarettes, and chewing tobacco. If you need help quitting, ask your health care provider.  Do not use street drugs.  Do not share needles.  Ask your health care provider for help if you need support or information about quitting drugs. Alcohol use  Do not drink alcohol if: ? Your health care provider tells you not to drink. ? You are pregnant, may be pregnant, or are planning to become pregnant.  If you drink alcohol: ? Limit how much you use to 0-1 drink a day. ? Limit intake if you are breastfeeding.  Be aware of how much alcohol is in your drink. In the [place] one drink equals one 12 oz bottle of beer (355 mL), one 5 oz glass of wine (148 mL), or one 1 oz glass of hard liquor (44 mL). General instructions  Schedule regular health, dental, and eye exams.  Stay current with your vaccines.  Tell your health care provider if: ? You often feel depressed. ? You have ever been abused or do not feel safe at home. Summary  Adopting a healthy lifestyle and getting preventive care are important in promoting health and wellness.  Follow your health care provider's instructions about healthy  diet, exercising, and getting tested or screened for diseases.  Follow your health care provider's instructions on monitoring your cholesterol and blood pressure. This information is not intended to replace advice given to you by your health care provider. Make sure you discuss any questions you have with your health care provider. Document Revised: 05/07/2018 Document Reviewed: 05/07/2018 Elsevier Patient Education  2020 Elsevier Inc.  

## 2019-06-26 LAB — PAIN MGMT, PROFILE 8 W/CONF, U
6 Acetylmorphine: NEGATIVE ng/mL
Alcohol Metabolites: NEGATIVE ng/mL (ref <500)
Amphetamines: NEGATIVE ng/mL
Benzodiazepines: NEGATIVE ng/mL
Buprenorphine, Urine: NEGATIVE ng/mL
Cocaine Metabolite: NEGATIVE ng/mL
Creatinine: 46.4 mg/dL
MDMA: NEGATIVE ng/mL
Marijuana Metabolite: NEGATIVE ng/mL
Opiates: NEGATIVE ng/mL
Oxidant: NEGATIVE ug/mL
Oxycodone: NEGATIVE ng/mL
pH: 7.8 (ref 4.5–9.0)

## 2019-06-30 ENCOUNTER — Telehealth: Payer: Self-pay

## 2019-06-30 NOTE — Addendum Note (Signed)
Addended by: Lurlean Nanny on: 06/30/2019 09:49 AM   Modules accepted: Orders

## 2019-06-30 NOTE — Telephone Encounter (Signed)
Cologuard order has been faxed

## 2019-07-24 ENCOUNTER — Ambulatory Visit
Admission: RE | Admit: 2019-07-24 | Discharge: 2019-07-24 | Disposition: A | Discharge status: Home / Self Care | Payer: Commercial Managed Care - PPO | Source: Ambulatory Visit | Attending: Internal Medicine | Admitting: Internal Medicine

## 2019-07-24 DIAGNOSIS — Z1231 Encounter for screening mammogram for malignant neoplasm of breast: Secondary | ICD-10-CM | POA: Diagnosis present

## 2019-07-24 DIAGNOSIS — Z0000 Encounter for general adult medical examination without abnormal findings: Secondary | ICD-10-CM

## 2019-08-13 ENCOUNTER — Ambulatory Visit: Payer: Commercial Managed Care - PPO | Attending: Internal Medicine

## 2019-08-13 DIAGNOSIS — Z23 Encounter for immunization: Secondary | ICD-10-CM

## 2019-08-13 NOTE — Progress Notes (Signed)
   Covid-19 Vaccination Clinic  Name:  MONICE MIRANTE    MRN: [id number] DOB: [birth year]  08/13/2019  Ms. Lockamy was observed post Covid-19 immunization for 15 minutes without incident. She was provided with Vaccine Information Sheet and instruction to access the V-Safe system.   Ms. Mccalmont was instructed to call 911 with any severe reactions post vaccine: Marland Kitchen Difficulty breathing  . Swelling of face and throat  . A fast heartbeat  . A bad rash all over body  . Dizziness and weakness   Immunizations Administered    Name Date Dose VIS Date Route   Pfizer COVID-19 Vaccine 08/13/2019  9:17 AM 0.3 mL 05/08/2019 Intramuscular   Manufacturer: [place]   Lot: [id number]   [place]: [id number]

## 2019-08-22 ENCOUNTER — Ambulatory Visit: Payer: Commercial Managed Care - PPO

## 2019-08-24 NOTE — Telephone Encounter (Signed)
Received fax from [employer] reporting the order faxed on 08/20/19 did not have all info needed. Contacted Chief Strategy Officer and they report the order was signed by the provider on 06/30/19 but they did not receive the fax until 08/20/19. The order is missing the providers NPI number. Need NPI number added to the existing order.

## 2019-08-26 ENCOUNTER — Other Ambulatory Visit: Payer: Self-pay | Admitting: Internal Medicine

## 2019-08-27 NOTE — Telephone Encounter (Signed)
I tried to log on to [employer] but kept getting an unable to load page error message, I will look into this next week when I return

## 2019-09-03 NOTE — Telephone Encounter (Signed)
I printed out the original order, and the NPI is present on the order, it has been refaxed

## 2019-09-08 ENCOUNTER — Ambulatory Visit: Payer: Commercial Managed Care - PPO | Attending: Internal Medicine

## 2019-09-08 DIAGNOSIS — Z23 Encounter for immunization: Secondary | ICD-10-CM

## 2019-09-08 NOTE — Progress Notes (Signed)
   Covid-19 Vaccination Clinic  Name:  LEKSI HERTZBERG    MRN: [id number] DOB: 02-07-1966  09/08/2019  Ms. Sprunger was observed post Covid-19 immunization for 15 minutes without incident. She was provided with Vaccine Information Sheet and instruction to access the V-Safe system.   Ms. Vaccarello was instructed to call 911 with any severe reactions post vaccine: Marland Kitchen Difficulty breathing  . Swelling of face and throat  . A fast heartbeat  . A bad rash all over body  . Dizziness and weakness   Immunizations Administered    Name Date Dose VIS Date Route   Pfizer COVID-19 Vaccine 09/08/2019  3:14 PM 0.3 mL 05/08/2019 Intramuscular   Manufacturer: [place]   Lot: [id number]   [place]: [id number]

## 2019-09-09 ENCOUNTER — Encounter: Payer: Self-pay | Admitting: Internal Medicine

## 2019-09-13 LAB — COLOGUARD

## 2019-09-16 ENCOUNTER — Encounter: Payer: Self-pay | Admitting: Internal Medicine

## 2019-09-16 NOTE — Telephone Encounter (Signed)
Cologuard (DNA stool test) result:  NEGATIVE  msg sent via mychart and lab letter mailed

## 2019-09-30 ENCOUNTER — Other Ambulatory Visit: Payer: Self-pay | Admitting: Internal Medicine

## 2019-09-30 DIAGNOSIS — F411 Generalized anxiety disorder: Secondary | ICD-10-CM

## 2019-09-30 MED ORDER — ALPRAZOLAM 0.25 MG PO TABS: 0.1250 mg | ORAL_TABLET | Freq: Every evening | ORAL | 0 refills | Status: DC | PRN

## 2019-09-30 NOTE — Telephone Encounter (Signed)
Last filled 02/17/2019... please advise UDS/CSA 05/2019

## 2020-04-28 ENCOUNTER — Other Ambulatory Visit: Payer: Self-pay | Admitting: Internal Medicine

## 2020-06-29 ENCOUNTER — Ambulatory Visit (INDEPENDENT_AMBULATORY_CARE_PROVIDER_SITE_OTHER): Payer: Commercial Managed Care - PPO | Admitting: Internal Medicine

## 2020-06-29 ENCOUNTER — Other Ambulatory Visit: Payer: Self-pay

## 2020-06-29 ENCOUNTER — Encounter: Payer: Self-pay | Admitting: Internal Medicine

## 2020-06-29 VITALS — BP 108/72 | HR 71 | Temp 97.6°F | Ht 62.25 in | Wt 128.0 lb

## 2020-06-29 DIAGNOSIS — F411 Generalized anxiety disorder: Secondary | ICD-10-CM

## 2020-06-29 DIAGNOSIS — G43919 Migraine, unspecified, intractable, without status migrainosus: Secondary | ICD-10-CM | POA: Diagnosis not present

## 2020-06-29 DIAGNOSIS — Z1231 Encounter for screening mammogram for malignant neoplasm of breast: Secondary | ICD-10-CM

## 2020-06-29 DIAGNOSIS — Z0001 Encounter for general adult medical examination with abnormal findings: Secondary | ICD-10-CM | POA: Diagnosis not present

## 2020-06-29 DIAGNOSIS — Z780 Asymptomatic menopausal state: Secondary | ICD-10-CM | POA: Diagnosis not present

## 2020-06-29 NOTE — Progress Notes (Signed)
Subjective:    Patient ID: Elon Spanner, female    DOB: March 30, 1966, 55 y.o.   MRN: [id number]  HPI  Patient presents to clinic today for her annual exam.  Anxiety: Triggered by caregiver stress.  She takes Xanax intermittently with good relief of symptoms.  She is not currently seeing a therapist.  She denies depression, SI/HI.  CSA and UDS from 05/2019 reviewed.  Migraines: Managed with daily Propranolol.  She is not currently following with neurology.  Flu: 03/2020 Tetanus: ? 2014 Covid: Pfizer x 2 Pap smear: 04/2017 Mammogram: 06/2019 Bone density: never Colon screening: Cologuard, 08/2019 Vision screening: annually Dentist: biannually  Diet: She does eat meat. She consumes fruits and veggies daily. She tries to avoid fried foods. She drinks mostly water, herbal tea and coffee. Exercise: Walking, rowing  Review of Systems      Past Medical History:  Diagnosis Date  . Anxiety   . Chicken pox   . History of migraine     Current Outpatient Medications  Medication Sig Dispense Refill  . ALPRAZolam (XANAX) 0.25 MG tablet Take 0.5 tablets (0.125 mg total) by mouth at bedtime as needed for anxiety. 30 tablet 0  . propranolol ER (INDERAL LA) 60 MG 24 hr capsule TAKE 1 CAPSULE BY MOUTH  DAILY 90 capsule 0   No current facility-administered medications for this visit.    No Known Allergies  Family History  Problem Relation Age of Onset  . Arthritis Mother   . Arthritis Father   . Breast cancer Maternal Aunt 80    Social History   Socioeconomic History  . Marital status: Married    Spouse name: Not on file  . Number of children: Not on file  . Years of education: Not on file  . Highest education level: Not on file  Occupational History  . Not on file  Tobacco Use  . Smoking status: Never Smoker  . Smokeless tobacco: Never Used  Substance and Sexual Activity  . Alcohol use: Yes    Alcohol/week: 0.0 standard drinks    Comment: occasional  . Drug use: No  .  Sexual activity: Yes  Other Topics Concern  . Not on file  Social History Narrative  . Not on file   Social Determinants of Health   Financial Resource Strain: Not on file  Food Insecurity: Not on file  Transportation Needs: Not on file  Physical Activity: Not on file  Stress: Not on file  Social Connections: Not on file  Intimate Partner Violence: Not on file     Constitutional: Patient reports intermittent headaches.  Denies fever, malaise, fatigue, or abrupt weight changes.  HEENT: Denies eye pain, eye redness, ear pain, ringing in the ears, wax buildup, runny nose, nasal congestion, bloody nose, or sore throat. Respiratory: Denies difficulty breathing, shortness of breath, cough or sputum production.   Cardiovascular: Denies chest pain, chest tightness, palpitations or swelling in the hands or feet.  Gastrointestinal: Denies abdominal pain, bloating, constipation, diarrhea or blood in the stool.  GU: Denies urgency, frequency, pain with urination, burning sensation, blood in urine, odor or discharge. Musculoskeletal: Denies decrease in range of motion, difficulty with gait, muscle pain or joint pain and swelling.  Skin: Denies redness, rashes, lesions or ulcercations.  Neurological: Denies dizziness, difficulty with memory, difficulty with speech or problems with balance and coordination.  Psych: Patient reports intermittent anxiety.  Denies depression, SI/HI.  No other specific complaints in a complete review of systems (except as listed  in HPI above).  Objective:   Physical Exam  BP 108/72   Pulse 71   Temp 97.6 F (36.4 C) (Temporal)   Ht 5' 2.25" (1.581 m)   Wt 128 lb (58.1 kg)   LMP 10/26/2012   SpO2 98%   BMI 23.22 kg/m   Wt Readings from Last 3 Encounters:  06/25/19 123 lb (55.8 kg)  05/13/18 128 lb (58.1 kg)  04/30/17 126 lb (57.2 kg)    General: Appears her stated age, well developed, well nourished in NAD. Skin: Warm, dry and intact. No rashes  noted. HEENT: Head: normal shape and size; Eyes: sclera white, no icterus, conjunctiva pink, PERRLA and EOMs intact;  Neck:  Neck supple, trachea midline. No masses, lumps or thyromegaly present.  Cardiovascular: Normal rate and rhythm. S1,S2 noted.  No murmur, rubs or gallops noted. No JVD or BLE edema. No carotid bruits noted. Pulmonary/Chest: Normal effort and positive vesicular breath sounds. No respiratory distress. No wheezes, rales or ronchi noted.  Abdomen: Soft and nontender. Normal bowel sounds. No distention or masses noted. Liver, spleen and kidneys non palpable. Musculoskeletal: Strength 5/5 BUE/BLE.  No difficulty with gait.  Neurological: Alert and oriented. Cranial nerves II-XII grossly intact. Coordination normal.  Psychiatric: Mood and affect normal. Behavior is normal. Judgment and thought content normal.     BMET    Component Value Date/Time   NA 138 06/25/2019 1248   K 4.1 06/25/2019 1248   CL 100 06/25/2019 1248   CO2 32 06/25/2019 1248   GLUCOSE 89 06/25/2019 1248   BUN 19 06/25/2019 1248   CREATININE 0.78 06/25/2019 1248   CALCIUM 10.1 06/25/2019 1248    Lipid Panel     Component Value Date/Time   CHOL 165 06/25/2019 1248   TRIG 74.0 06/25/2019 1248   HDL 65.90 06/25/2019 1248   CHOLHDL 3 06/25/2019 1248   VLDL 14.8 06/25/2019 1248   LDLCALC 84 06/25/2019 1248    CBC    Component Value Date/Time   WBC 5.9 06/25/2019 1248   RBC 4.42 06/25/2019 1248   HGB 13.2 06/25/2019 1248   HCT 39.2 06/25/2019 1248   PLT 282.0 06/25/2019 1248   MCV 88.8 06/25/2019 1248   MCHC 33.8 06/25/2019 1248   RDW 12.7 06/25/2019 1248    Hgb A1C No results found for: HGBA1C         Assessment & Plan:   Preventative Health Maintenance:  Flu shot UTD Tetanus UTD per her report Encouraged her to get her third COVID booster Pap smear UTD Mammogram ordered, she will call to schedule Bone density ordered, she will call to schedule Colon screening  UTD Encouraged her to consume a balanced diet and exercise regimen Advised her to see an eye doctor and dentist annually We will check CBC, c-Met, lipid profile and vitamin D today  RTC in 1 year, sooner if needed Webb Silversmith, NP This visit occurred during the SARS-CoV-2 public health emergency.  Safety protocols were in place, including screening questions prior to the visit, additional usage of staff PPE, and extensive cleaning of exam room while observing appropriate contact time as indicated for disinfecting solutions.

## 2020-06-30 ENCOUNTER — Encounter: Payer: Self-pay | Admitting: Internal Medicine

## 2020-06-30 LAB — LIPID PANEL
Cholesterol: 174 mg/dL (ref 0–200)
HDL: 64.2 mg/dL (ref >39.00)
LDL Cholesterol: 86 mg/dL (ref 0–99)
NonHDL: 110.27
Total CHOL/HDL Ratio: 3
Triglycerides: 122 mg/dL (ref 0.0–149.0)
VLDL: 24.4 mg/dL (ref 0.0–40.0)

## 2020-06-30 LAB — COMPREHENSIVE METABOLIC PANEL
ALT: 16 U/L (ref 0–35)
AST: 20 U/L (ref 0–37)
Albumin: 4.6 g/dL (ref 3.5–5.2)
Alkaline Phosphatase: 74 U/L (ref 39–117)
BUN: 22 mg/dL (ref 6–23)
CO2: 32 mEq/L (ref 19–32)
Calcium: 10 mg/dL (ref 8.4–10.5)
Chloride: 101 mEq/L (ref 96–112)
Creatinine, Ser: 0.78 mg/dL (ref 0.40–1.20)
GFR: 84.95 mL/min (ref >60.00)
Glucose, Bld: 78 mg/dL (ref 70–99)
Potassium: 4.3 mEq/L (ref 3.5–5.1)
Sodium: 138 mEq/L (ref 135–145)
Total Bilirubin: 0.4 mg/dL (ref 0.2–1.2)
Total Protein: 7.5 g/dL (ref 6.0–8.3)

## 2020-06-30 LAB — CBC
HCT: 39.3 % (ref 36.0–46.0)
Hemoglobin: 13.2 g/dL (ref 12.0–15.0)
MCHC: 33.5 g/dL (ref 30.0–36.0)
MCV: 88.6 fl (ref 78.0–100.0)
Platelets: 286 10*3/uL (ref 150.0–400.0)
RBC: 4.44 Mil/uL (ref 3.87–5.11)
RDW: 13 % (ref 11.5–15.5)
WBC: 6.6 10*3/uL (ref 4.0–10.5)

## 2020-06-30 LAB — VITAMIN D 25 HYDROXY (VIT D DEFICIENCY, FRACTURES): VITD: 45.19 ng/mL (ref 30.00–100.00)

## 2020-06-30 NOTE — Assessment & Plan Note (Signed)
Continue Xanax as needed Support offered 

## 2020-06-30 NOTE — Assessment & Plan Note (Signed)
Continue Propranolol We will monitor

## 2020-06-30 NOTE — Patient Instructions (Signed)

## 2020-07-17 ENCOUNTER — Other Ambulatory Visit: Payer: Self-pay | Admitting: Internal Medicine

## 2020-07-20 ENCOUNTER — Other Ambulatory Visit: Payer: Self-pay | Admitting: Internal Medicine

## 2020-07-20 DIAGNOSIS — F411 Generalized anxiety disorder: Secondary | ICD-10-CM

## 2020-07-20 MED ORDER — ALPRAZOLAM 0.25 MG PO TABS: 0.1250 mg | ORAL_TABLET | Freq: Every evening | ORAL | 0 refills | Status: DC | PRN

## 2020-07-20 NOTE — Telephone Encounter (Signed)
Last filled 09/30/2019... please advise

## 2020-07-25 ENCOUNTER — Other Ambulatory Visit: Payer: Self-pay

## 2020-07-25 ENCOUNTER — Ambulatory Visit
Admission: RE | Admit: 2020-07-25 | Discharge: 2020-07-25 | Disposition: A | Discharge status: Home / Self Care | Payer: Commercial Managed Care - PPO | Source: Ambulatory Visit | Attending: Internal Medicine | Admitting: Internal Medicine

## 2020-07-25 DIAGNOSIS — Z1231 Encounter for screening mammogram for malignant neoplasm of breast: Secondary | ICD-10-CM | POA: Insufficient documentation

## 2020-07-25 DIAGNOSIS — Z780 Asymptomatic menopausal state: Secondary | ICD-10-CM | POA: Insufficient documentation

## 2020-07-29 ENCOUNTER — Encounter: Payer: Self-pay | Admitting: Internal Medicine

## 2021-03-01 ENCOUNTER — Other Ambulatory Visit: Payer: Self-pay | Admitting: Internal Medicine

## 2021-03-01 DIAGNOSIS — F411 Generalized anxiety disorder: Secondary | ICD-10-CM

## 2021-03-02 NOTE — Telephone Encounter (Signed)
Name of Medication: Alprazolam Name of Pharmacy: [facility] [place] or Written Date and Quantity: 07/20/20, #30 Last Office Visit and Type: 06/29/20, CPE Next Office Visit and Type: none Last Controlled Substance Agreement Date: 06/25/19 Last UDS: 06/25/19

## 2021-03-07 MED ORDER — ALPRAZOLAM 0.25 MG PO TABS: 0.1250 mg | ORAL_TABLET | Freq: Every evening | ORAL | 0 refills | Status: DC | PRN

## 2021-03-07 NOTE — Telephone Encounter (Signed)
Sent. Is she going to f/u here at the clinic?  If so, needs new PCP assigned.  Thanks.

## 2021-03-13 NOTE — Telephone Encounter (Signed)
CALLED PATIENT AND SHE STATED THAT SHE IS GOING TO [facility]

## 2021-07-05 ENCOUNTER — Ambulatory Visit (INDEPENDENT_AMBULATORY_CARE_PROVIDER_SITE_OTHER): Payer: Commercial Managed Care - PPO | Admitting: Internal Medicine

## 2021-07-05 ENCOUNTER — Other Ambulatory Visit: Payer: Self-pay

## 2021-07-05 ENCOUNTER — Encounter: Payer: Self-pay | Admitting: Internal Medicine

## 2021-07-05 VITALS — BP 136/89 | HR 68 | Temp 97.3°F | Ht 62.0 in | Wt 133.0 lb

## 2021-07-05 DIAGNOSIS — M816 Localized osteoporosis [Lequesne]: Secondary | ICD-10-CM | POA: Diagnosis not present

## 2021-07-05 DIAGNOSIS — F411 Generalized anxiety disorder: Secondary | ICD-10-CM

## 2021-07-05 DIAGNOSIS — Z1231 Encounter for screening mammogram for malignant neoplasm of breast: Secondary | ICD-10-CM

## 2021-07-05 DIAGNOSIS — Z0001 Encounter for general adult medical examination with abnormal findings: Secondary | ICD-10-CM | POA: Diagnosis not present

## 2021-07-05 DIAGNOSIS — G43919 Migraine, unspecified, intractable, without status migrainosus: Secondary | ICD-10-CM | POA: Diagnosis not present

## 2021-07-05 DIAGNOSIS — M810 Age-related osteoporosis without current pathological fracture: Secondary | ICD-10-CM | POA: Insufficient documentation

## 2021-07-05 LAB — CBC
HCT: 40.4 % (ref 35.0–45.0)
Hemoglobin: 13.5 g/dL (ref 11.7–15.5)
MCH: 29.6 pg (ref 27.0–33.0)
MCHC: 33.4 g/dL (ref 32.0–36.0)
MCV: 88.6 fL (ref 80.0–100.0)
MPV: 9.8 fL (ref 7.5–12.5)
Platelets: 299 10*3/uL (ref 140–400)
RBC: 4.56 10*6/uL (ref 3.80–5.10)
RDW: 12.8 % (ref 11.0–15.0)
WBC: 5.9 10*3/uL (ref 3.8–10.8)

## 2021-07-05 LAB — COMPLETE METABOLIC PANEL WITH GFR
AG Ratio: 1.8 (calc) (ref 1.0–2.5)
ALT: 13 U/L (ref 6–29)
AST: 19 U/L (ref 10–35)
Albumin: 4.7 g/dL (ref 3.6–5.1)
Alkaline phosphatase (APISO): 89 U/L (ref 37–153)
BUN/Creatinine Ratio: 31 (calc) — ABNORMAL HIGH (ref 6–22)
BUN: 26 mg/dL — ABNORMAL HIGH (ref 7–25)
CO2: 30 mmol/L (ref 20–32)
Calcium: 10.5 mg/dL — ABNORMAL HIGH (ref 8.6–10.4)
Chloride: 102 mmol/L (ref 98–110)
Creat: 0.83 mg/dL (ref 0.50–1.03)
Globulin: 2.6 g/dL (calc) (ref 1.9–3.7)
Glucose, Bld: 93 mg/dL (ref 65–99)
Potassium: 4.7 mmol/L (ref 3.5–5.3)
Sodium: 140 mmol/L (ref 135–146)
Total Bilirubin: 0.6 mg/dL (ref 0.2–1.2)
Total Protein: 7.3 g/dL (ref 6.1–8.1)
eGFR: 82 mL/min/{1.73_m2} (ref >=60)

## 2021-07-05 LAB — LIPID PANEL
Cholesterol: 178 mg/dL (ref <200)
HDL: 70 mg/dL (ref >=50)
LDL Cholesterol (Calc): 87 mg/dL (calc)
Non-HDL Cholesterol (Calc): 108 mg/dL (calc) (ref <130)
Total CHOL/HDL Ratio: 2.5 (calc) (ref <5.0)
Triglycerides: 110 mg/dL (ref <150)

## 2021-07-05 NOTE — Assessment & Plan Note (Signed)
Continue Xanax as needed Support offered 

## 2021-07-05 NOTE — Patient Instructions (Signed)
Health Maintenance for Postmenopausal Women ?Menopause is a normal process in which your ability to get pregnant comes to an end. This process happens slowly over many months or years, usually between the ages of 48 and 55. Menopause is complete when you have missed your menstrual period for 12 months. ?It is important to talk with your health care provider about some of the most common conditions that affect women after menopause (postmenopausal women). These include heart disease, cancer, and bone loss (osteoporosis). Adopting a healthy lifestyle and getting preventive care can help to promote your health and wellness. The actions you take can also lower your chances of developing some of these common conditions. ?What are the signs and symptoms of menopause? ?During menopause, you may have the following symptoms: ?Hot flashes. These can be moderate or severe. ?Night sweats. ?Decrease in sex drive. ?Mood swings. ?Headaches. ?Tiredness (fatigue). ?Irritability. ?Memory problems. ?Problems falling asleep or staying asleep. ?Talk with your health care provider about treatment options for your symptoms. ?Do I need hormone replacement therapy? ?Hormone replacement therapy is effective in treating symptoms that are caused by menopause, such as hot flashes and night sweats. ?Hormone replacement carries certain risks, especially as you become older. If you are thinking about using estrogen or estrogen with progestin, discuss the benefits and risks with your health care provider. ?How can I reduce my risk for heart disease and stroke? ?The risk of heart disease, heart attack, and stroke increases as you age. One of the causes may be a change in the body's hormones during menopause. This can affect how your body uses dietary fats, triglycerides, and cholesterol. Heart attack and stroke are medical emergencies. There are many things that you can do to help prevent heart disease and stroke. ?Watch your blood pressure ?High  blood pressure causes heart disease and increases the risk of stroke. This is more likely to develop in people who have high blood pressure readings or are overweight. ?Have your blood pressure checked: ?Every 3-5 years if you are 18-39 years of age. ?Every year if you are 40 years old or older. ?Eat a healthy diet ? ?Eat a diet that includes plenty of vegetables, fruits, low-fat dairy products, and lean protein. ?Do not eat a lot of foods that are high in solid fats, added sugars, or sodium. ?Get regular exercise ?Get regular exercise. This is one of the most important things you can do for your health. Most adults should: ?Try to exercise for at least 150 minutes each week. The exercise should increase your heart rate and make you sweat (moderate-intensity exercise). ?Try to do strengthening exercises at least twice each week. Do these in addition to the moderate-intensity exercise. ?Spend less time sitting. Even light physical activity can be beneficial. ?Other tips ?Work with your health care provider to achieve or maintain a healthy weight. ?Do not use any products that contain nicotine or tobacco. These products include cigarettes, chewing tobacco, and vaping devices, such as e-cigarettes. If you need help quitting, ask your health care provider. ?Know your numbers. Ask your health care provider to check your cholesterol and your blood sugar (glucose). Continue to have your blood tested as directed by your health care provider. ?Do I need screening for cancer? ?Depending on your health history and family history, you may need to have cancer screenings at different stages of your life. This may include screening for: ?Breast cancer. ?Cervical cancer. ?Lung cancer. ?Colorectal cancer. ?What is my risk for osteoporosis? ?After menopause, you may be   at increased risk for osteoporosis. Osteoporosis is a condition in which bone destruction happens more quickly than new bone creation. To help prevent osteoporosis or  the bone fractures that can happen because of osteoporosis, you may take the following actions: ?If you are 19-50 years old, get at least 1,000 mg of calcium and at least 600 international units (IU) of vitamin D per day. ?If you are older than age 50 but younger than age 70, get at least 1,200 mg of calcium and at least 600 international units (IU) of vitamin D per day. ?If you are older than age 70, get at least 1,200 mg of calcium and at least 800 international units (IU) of vitamin D per day. ?Smoking and drinking excessive alcohol increase the risk of osteoporosis. Eat foods that are rich in calcium and vitamin D, and do weight-bearing exercises several times each week as directed by your health care provider. ?How does menopause affect my mental health? ?Depression may occur at any age, but it is more common as you become older. Common symptoms of depression include: ?Feeling depressed. ?Changes in sleep patterns. ?Changes in appetite or eating patterns. ?Feeling an overall lack of motivation or enjoyment of activities that you previously enjoyed. ?Frequent crying spells. ?Talk with your health care provider if you think that you are experiencing any of these symptoms. ?General instructions ?See your health care provider for regular wellness exams and vaccines. This may include: ?Scheduling regular health, dental, and eye exams. ?Getting and maintaining your vaccines. These include: ?Influenza vaccine. Get this vaccine each year before the flu season begins. ?Pneumonia vaccine. ?Shingles vaccine. ?Tetanus, diphtheria, and pertussis (Tdap) booster vaccine. ?Your health care provider may also recommend other immunizations. ?Tell your health care provider if you have ever been abused or do not feel safe at home. ?Summary ?Menopause is a normal process in which your ability to get pregnant comes to an end. ?This condition causes hot flashes, night sweats, decreased interest in sex, mood swings, headaches, or lack  of sleep. ?Treatment for this condition may include hormone replacement therapy. ?Take actions to keep yourself healthy, including exercising regularly, eating a healthy diet, watching your weight, and checking your blood pressure and blood sugar levels. ?Get screened for cancer and depression. Make sure that you are up to date with all your vaccines. ?This information is not intended to replace advice given to you by your health care provider. Make sure you discuss any questions you have with your health care provider. ?Document Revised: 10/03/2020 Document Reviewed: 10/03/2020 ?Elsevier Patient Education ? 2022 [employer] ? ?

## 2021-07-05 NOTE — Assessment & Plan Note (Signed)
Controlled on Prorpanolol We will monitor

## 2021-07-05 NOTE — Progress Notes (Signed)
Subjective:    Patient ID: Veronica Leach, female    DOB: 09/14/1965, 56 y.o.   MRN: [id number]  HPI  Pt presents to the clinic today for her annual exam. She is also due to follow up chronic conditions.  Migraines: These occur rarely. Triggered by stress. She is taking Propranolol as prescribed. She does not follow with neurology.  Xanax: Situational. She takes Xanax as needed. She is not currently seeing a therapist. She denies depression, SI/HI.  Osteoporosis: Bone density from 06/2020 reviewed. She was on Boniva in the past. She takes Vit D daily and gets weight bearing exercise.  Flu: 04/2021 Tetanus: 06/2012 Covid: Pfizer x 2 Shingrix: never Pap smear: 04/2017 Mammogram: 07/2020 Bone density: 06/2020 Colon screening: 07/2019 Vision screening: annually Dentist: biannually  Diet: She does eat meat. She consumes fruits and veggies. She tries to avoid fried foods. She drinks mostly water, herbal tea. Exercise: Walking, spin 4-5 days per week  Review of Systems  Past Medical History:  Diagnosis Date   Anxiety    Chicken pox    History of migraine     Current Outpatient Medications  Medication Sig Dispense Refill   ALPRAZolam (XANAX) 0.25 MG tablet Take 0.5 tablets (0.125 mg total) by mouth at bedtime as needed for anxiety. 30 tablet 0   propranolol ER (INDERAL LA) 60 MG 24 hr capsule TAKE 1 CAPSULE BY MOUTH  DAILY 90 capsule 3   No current facility-administered medications for this visit.    No Known Allergies  Family History  Problem Relation Age of Onset   Arthritis Mother    Arthritis Father    Breast cancer Maternal Aunt 80    Social History   Socioeconomic History   Marital status: Married    Spouse name: Not on file   Number of children: Not on file   Years of education: Not on file   Highest education level: Not on file  Occupational History   Not on file  Tobacco Use   Smoking status: Never   Smokeless tobacco: Never  Substance and Sexual  Activity   Alcohol use: Yes    Alcohol/week: 0.0 standard drinks    Comment: occasional   Drug use: No   Sexual activity: Yes  Other Topics Concern   Not on file  Social History Narrative   Not on file   Social Determinants of Health   Financial Resource Strain: Not on file  Food Insecurity: Not on file  Transportation Needs: Not on file  Physical Activity: Not on file  Stress: Not on file  Social Connections: Not on file  Intimate Partner Violence: Not on file     Constitutional: Patient reports intermittent headaches.  Denies fever, malaise, fatigue, or abrupt weight changes.  HEENT: Denies eye pain, eye redness, ear pain, ringing in the ears, wax buildup, runny nose, nasal congestion, bloody nose, or sore throat. Respiratory: Denies difficulty breathing, shortness of breath, cough or sputum production.   Cardiovascular: Denies chest pain, chest tightness, palpitations or swelling in the hands or feet.  Gastrointestinal: Denies abdominal pain, bloating, constipation, diarrhea or blood in the stool.  GU: Denies urgency, frequency, pain with urination, burning sensation, blood in urine, odor or discharge. Musculoskeletal: Denies decrease in range of motion, difficulty with gait, muscle pain or joint pain and swelling.  Skin: Denies redness, rashes, lesions or ulcercations.  Neurological: Denies dizziness, difficulty with memory, difficulty with speech or problems with balance and coordination.  Psych: Patient has a history  of anxiety.  Denies depression, SI/HI.  No other specific complaints in a complete review of systems (except as listed in HPI above).     Objective:   Physical Exam   BP 136/89 (BP Location: Right Arm, Patient Position: Sitting, Cuff Size: Normal)    Pulse 68    Temp (!) 97.3 F (36.3 C) (Temporal)    Ht '5\' 2"'  (1.575 m)    Wt 133 lb (60.3 kg)    LMP 10/26/2012    SpO2 98%    BMI 24.33 kg/m   Wt Readings from Last 3 Encounters:  06/29/20 128 lb (58.1  kg)  06/25/19 123 lb (55.8 kg)  05/13/18 128 lb (58.1 kg)    General: Appears her stated age, well developed, well nourished in NAD. Skin: Warm, dry and intact.  HEENT: Head: normal shape and size; Eyes: sclera white and EOMs intact;  Neck:  Neck supple, trachea midline. No masses, lumps or thyromegaly present.  Cardiovascular: Normal rate and rhythm. S1,S2 noted.  No murmur, rubs or gallops noted. No JVD or BLE edema. No carotid bruits noted. Pulmonary/Chest: Normal effort and positive vesicular breath sounds. No respiratory distress. No wheezes, rales or ronchi noted.  Abdomen: Soft and nontender. Normal bowel sounds.  Musculoskeletal: Strength/5 BUE/BLE.  No difficulty with gait.  Neurological: Alert and oriented. Cranial nerves II-XII grossly intact. Coordination normal.  Psychiatric: Mood and affect normal. Behavior is normal. Judgment and thought content normal.    BMET    Component Value Date/Time   NA 138 06/29/2020 1449   K 4.3 06/29/2020 1449   CL 101 06/29/2020 1449   CO2 32 06/29/2020 1449   GLUCOSE 78 06/29/2020 1449   BUN 22 06/29/2020 1449   CREATININE 0.78 06/29/2020 1449   CALCIUM 10.0 06/29/2020 1449    Lipid Panel     Component Value Date/Time   CHOL 174 06/29/2020 1449   TRIG 122.0 06/29/2020 1449   HDL 64.20 06/29/2020 1449   CHOLHDL 3 06/29/2020 1449   VLDL 24.4 06/29/2020 1449   LDLCALC 86 06/29/2020 1449    CBC    Component Value Date/Time   WBC 6.6 06/29/2020 1449   RBC 4.44 06/29/2020 1449   HGB 13.2 06/29/2020 1449   HCT 39.3 06/29/2020 1449   PLT 286.0 06/29/2020 1449   MCV 88.6 06/29/2020 1449   MCHC 33.5 06/29/2020 1449   RDW 13.0 06/29/2020 1449    Hgb A1C No results found for: HGBA1C         Assessment & Plan:   Preventative Health Maintenance:  Flu UTD Tetanus UTD Encouraged her to get her COVID booster Discussed Shingrix vaccine, she will check coverage with her insurance company Pap smear due 04/2023 Mammogram  UTD Bone density UTD Colon screening UTD Encouraged her to consume a balanced diet and exercise regimen Advised her to see an eye doctor and dentist annually We will check CBC, c-Met and lipid profile today  RTC in 1 year, sooner if needed Webb Silversmith, NP This visit occurred during the SARS-CoV-2 public health emergency.  Safety protocols were in place, including screening questions prior to the visit, additional usage of staff PPE, and extensive cleaning of exam room while observing appropriate contact time as indicated for disinfecting solutions.

## 2021-07-05 NOTE — Assessment & Plan Note (Signed)
Continue vitamin D and daily weightbearing exercise Discussed Prolia versus Reclast, she will consider this and let me know We will repeat bone density next year

## 2021-07-06 ENCOUNTER — Other Ambulatory Visit: Payer: Self-pay | Admitting: Internal Medicine

## 2021-07-06 NOTE — Telephone Encounter (Signed)
Refilled 6 months per protocol Requested Prescriptions  Pending Prescriptions Disp Refills   propranolol ER (INDERAL LA) 60 MG 24 hr capsule [Pharmacy Med Name: PROPRANOLOL  60MG   CAP  EXTENDED RELEASE] 90 capsule 3    Sig: TAKE 1 CAPSULE BY MOUTH  DAILY     Cardiovascular:  Beta Blockers Passed - 07/06/2021  8:26 AM      Passed - Last BP in normal range    BP Readings from Last 1 Encounters:  07/05/21 136/89         Passed - Last Heart Rate in normal range    Pulse Readings from Last 1 Encounters:  07/05/21 68         Passed - Valid encounter within last 6 months    Recent Outpatient Visits          Yesterday Encounter for general adult medical examination with abnormal findings   [facility] [place], Coralie Keens, NP

## 2021-08-17 ENCOUNTER — Ambulatory Visit
Admission: RE | Admit: 2021-08-17 | Discharge: 2021-08-17 | Disposition: A | Discharge status: Home / Self Care | Payer: Commercial Managed Care - PPO | Source: Ambulatory Visit | Attending: Internal Medicine | Admitting: Internal Medicine

## 2021-08-17 ENCOUNTER — Other Ambulatory Visit: Payer: Self-pay

## 2021-08-17 DIAGNOSIS — Z1231 Encounter for screening mammogram for malignant neoplasm of breast: Secondary | ICD-10-CM | POA: Insufficient documentation

## 2021-08-18 ENCOUNTER — Other Ambulatory Visit: Payer: Self-pay | Admitting: Internal Medicine

## 2021-08-18 ENCOUNTER — Other Ambulatory Visit: Payer: Self-pay | Admitting: Family Medicine

## 2021-08-18 DIAGNOSIS — R928 Other abnormal and inconclusive findings on diagnostic imaging of breast: Secondary | ICD-10-CM

## 2021-08-18 DIAGNOSIS — N63 Unspecified lump in unspecified breast: Secondary | ICD-10-CM

## 2021-08-18 DIAGNOSIS — F411 Generalized anxiety disorder: Secondary | ICD-10-CM

## 2021-08-18 MED ORDER — ALPRAZOLAM 0.25 MG PO TABS: 0.1250 mg | ORAL_TABLET | Freq: Every evening | ORAL | 0 refills | Status: DC | PRN

## 2021-09-07 ENCOUNTER — Ambulatory Visit
Admission: RE | Admit: 2021-09-07 | Discharge: 2021-09-07 | Disposition: A | Discharge status: Home / Self Care | Payer: Commercial Managed Care - PPO | Source: Ambulatory Visit | Attending: Internal Medicine | Admitting: Internal Medicine

## 2021-09-07 DIAGNOSIS — R928 Other abnormal and inconclusive findings on diagnostic imaging of breast: Secondary | ICD-10-CM | POA: Diagnosis present

## 2021-09-07 DIAGNOSIS — N63 Unspecified lump in unspecified breast: Secondary | ICD-10-CM

## 2021-12-19 ENCOUNTER — Other Ambulatory Visit: Payer: Self-pay | Admitting: Internal Medicine

## 2021-12-21 NOTE — Telephone Encounter (Signed)
Requested Prescriptions  Pending Prescriptions Disp Refills  . propranolol ER (INDERAL LA) 60 MG 24 hr capsule [Pharmacy Med Name: PROPRANOLOL  '60MG'$   CAP  EXTENDED RELEASE] 90 capsule 0    Sig: TAKE 1 CAPSULE BY MOUTH DAILY     Cardiovascular:  Beta Blockers Passed - 12/19/2021 10:06 PM      Passed - Last BP in normal range    BP Readings from Last 1 Encounters:  07/05/21 136/89         Passed - Last Heart Rate in normal range    Pulse Readings from Last 1 Encounters:  07/05/21 68         Passed - Valid encounter within last 6 months    Recent Outpatient Visits          5 months ago Encounter for general adult medical examination with abnormal findings   [facility] [place], Coralie Keens, NP

## 2022-01-23 ENCOUNTER — Encounter: Payer: Self-pay | Admitting: Internal Medicine

## 2022-01-23 DIAGNOSIS — R928 Other abnormal and inconclusive findings on diagnostic imaging of breast: Secondary | ICD-10-CM

## 2022-03-12 ENCOUNTER — Ambulatory Visit
Admission: RE | Admit: 2022-03-12 | Discharge: 2022-03-12 | Disposition: A | Discharge status: Home / Self Care | Payer: Commercial Managed Care - PPO | Source: Ambulatory Visit | Attending: Internal Medicine | Admitting: Internal Medicine

## 2022-03-12 DIAGNOSIS — R928 Other abnormal and inconclusive findings on diagnostic imaging of breast: Secondary | ICD-10-CM | POA: Insufficient documentation

## 2022-03-15 ENCOUNTER — Other Ambulatory Visit: Payer: Self-pay | Admitting: Internal Medicine

## 2022-03-15 NOTE — Telephone Encounter (Signed)
Call to patient- she has medication on hand- does not need RF at this time. Requested Prescriptions  Pending Prescriptions Disp Refills  . propranolol ER (INDERAL LA) 60 MG 24 hr capsule [Pharmacy Med Name: PROPRANOLOL  '60MG'$   CAP  EXTENDED RELEASE] 90 capsule 3    Sig: TAKE 1 CAPSULE BY MOUTH DAILY     Cardiovascular:  Beta Blockers Failed - 03/15/2022  5:32 AM      Failed - Valid encounter within last 6 months    Recent Outpatient Visits          8 months ago Encounter for general adult medical examination with abnormal findings   [facility] [place], Regina W, NP             Passed - Last BP in normal range    BP Readings from Last 1 Encounters:  07/05/21 136/89         Passed - Last Heart Rate in normal range    Pulse Readings from Last 1 Encounters:  07/05/21 68

## 2022-08-08 ENCOUNTER — Encounter: Payer: Self-pay | Admitting: Internal Medicine

## 2022-08-08 ENCOUNTER — Ambulatory Visit (INDEPENDENT_AMBULATORY_CARE_PROVIDER_SITE_OTHER): Payer: Commercial Managed Care - PPO | Admitting: Internal Medicine

## 2022-08-08 ENCOUNTER — Other Ambulatory Visit (HOSPITAL_COMMUNITY)
Admission: RE | Admit: 2022-08-08 | Discharge: 2022-08-08 | Disposition: A | Discharge status: Home / Self Care | Payer: Commercial Managed Care - PPO | Source: Ambulatory Visit | Attending: Internal Medicine | Admitting: Internal Medicine

## 2022-08-08 VITALS — BP 126/80 | HR 70 | Temp 96.8°F | Ht 62.0 in | Wt 132.0 lb

## 2022-08-08 DIAGNOSIS — G43919 Migraine, unspecified, intractable, without status migrainosus: Secondary | ICD-10-CM

## 2022-08-08 DIAGNOSIS — F411 Generalized anxiety disorder: Secondary | ICD-10-CM

## 2022-08-08 DIAGNOSIS — Z124 Encounter for screening for malignant neoplasm of cervix: Secondary | ICD-10-CM

## 2022-08-08 DIAGNOSIS — Z1211 Encounter for screening for malignant neoplasm of colon: Secondary | ICD-10-CM | POA: Diagnosis not present

## 2022-08-08 DIAGNOSIS — M816 Localized osteoporosis [Lequesne]: Secondary | ICD-10-CM | POA: Diagnosis not present

## 2022-08-08 DIAGNOSIS — Z1231 Encounter for screening mammogram for malignant neoplasm of breast: Secondary | ICD-10-CM

## 2022-08-08 DIAGNOSIS — Z0001 Encounter for general adult medical examination with abnormal findings: Secondary | ICD-10-CM | POA: Diagnosis not present

## 2022-08-08 MED ORDER — ALPRAZOLAM 0.25 MG PO TABS: 0.1250 mg | ORAL_TABLET | Freq: Every evening | ORAL | 0 refills | Status: DC | PRN

## 2022-08-08 MED ORDER — PROPRANOLOL HCL ER 60 MG PO CP24: 60.0000 mg | ORAL_CAPSULE | Freq: Every day | ORAL | 3 refills | Status: DC

## 2022-08-08 NOTE — Progress Notes (Signed)
Subjective:    Patient ID: Veronica Leach, female    DOB: May 10, 1966, 57 y.o.   MRN: [id number]  HPI  Patient presents to clinic today for her annual exam.  She is also due to follow-up chronic conditions.  Migraines: These occur rarely.  Managed with Propranolol.  She does not follow with neurology.  This: Her last bone density from 08/2020 reviewed.  She reports she was on Boniva for 10 years and did not see any improvement in her bone density.  She is taking Calcium and Vitamin D OTC and getting weightbearing exercise daily.  Anxiety: Chronic, managed with Propranolol and Xanax.  She is not currently seeing a therapist.  She denies depression, SI/HI.   Flu: 04/2022 Tetanus: 06/2012 COVID: Pfizer x 1 Shingrix: Never Pap smear: 04/2017 Mammogram: 02/2022 Bone density: 06/2020 Colon screening: 08/2019, Cologuard Vision screening: annually Dentist: biannually  Diet: She does eat some meat. She consumes fruits and veggies. She does eat fried foods. She drinks mostly water, herbal tea Exercise: 3 x week, weights and walking  Review of Systems     Past Medical History:  Diagnosis Date   Anxiety    Chicken pox    History of migraine     Current Outpatient Medications  Medication Sig Dispense Refill   ALPRAZolam (XANAX) 0.25 MG tablet Take 0.5 tablets (0.125 mg total) by mouth at bedtime as needed for anxiety. 30 tablet 0   propranolol ER (INDERAL LA) 60 MG 24 hr capsule TAKE 1 CAPSULE BY MOUTH DAILY 90 capsule 0   No current facility-administered medications for this visit.    No Known Allergies  Family History  Problem Relation Age of Onset   Arthritis Mother    Arthritis Father    Breast cancer Maternal Aunt 80   Colon cancer Neg Hx     Social History   Socioeconomic History   Marital status: Married    Spouse name: Not on file   Number of children: Not on file   Years of education: Not on file   Highest education level: Not on file  Occupational History    Not on file  Tobacco Use   Smoking status: Never   Smokeless tobacco: Never  Vaping Use   Vaping Use: Never used  Substance and Sexual Activity   Alcohol use: Yes    Alcohol/week: 0.0 standard drinks of alcohol    Comment: occasional   Drug use: No   Sexual activity: Yes  Other Topics Concern   Not on file  Social History Narrative   Not on file   Social Determinants of Health   Financial Resource Strain: Not on file  Food Insecurity: Not on file  Transportation Needs: Not on file  Physical Activity: Not on file  Stress: Not on file  Social Connections: Not on file  Intimate Partner Violence: Not on file     Constitutional: Patient reports intermittent headaches.  Denies fever, malaise, fatigue, or abrupt weight changes.  HEENT: Denies eye pain, eye redness, ear pain, ringing in the ears, wax buildup, runny nose, nasal congestion, bloody nose, or sore throat. Respiratory: Denies difficulty breathing, shortness of breath, cough or sputum production.   Cardiovascular: Denies chest pain, chest tightness, palpitations or swelling in the hands or feet.  Gastrointestinal: Denies abdominal pain, bloating, constipation, diarrhea or blood in the stool.  GU: Denies urgency, frequency, pain with urination, burning sensation, blood in urine, odor or discharge. Musculoskeletal: Denies decrease in range of motion, difficulty with  gait, muscle pain or joint pain and swelling.  Skin: Denies redness, rashes, lesions or ulcercations.  Neurological: Denies dizziness, difficulty with memory, difficulty with speech or problems with balance and coordination.  Psych: Patient has a history of anxiety.  Denies depression, SI/HI.  No other specific complaints in a complete review of systems (except as listed in HPI above).   Objective:   Physical Exam  BP 126/80 (BP Location: Left Arm, Patient Position: Sitting, Cuff Size: Normal)   Pulse 70   Temp (!) 96.8 F (36 C) (Temporal)   Ht '5\' 2"'$   (1.575 m)   Wt 132 lb (59.9 kg)   LMP 10/26/2012   SpO2 97%   BMI 24.14 kg/m   Wt Readings from Last 3 Encounters:  07/05/21 133 lb (60.3 kg)  06/29/20 128 lb (58.1 kg)  06/25/19 123 lb (55.8 kg)    General: Appears her stated age, well developed, well nourished in NAD. Skin: Warm, dry and intact.  HEENT: Head: normal shape and size; Eyes: sclera white, no icterus, conjunctiva pink, PERRLA and EOMs intact;  Neck:  Neck supple, trachea midline. No masses, lumps or thyromegaly present.  Cardiovascular: Normal rate and rhythm. S1,S2 noted.  No murmur, rubs or gallops noted. No JVD or BLE edema. No carotid bruits noted. Pulmonary/Chest: Normal effort and positive vesicular breath sounds. No respiratory distress. No wheezes, rales or ronchi noted.  Abdomen: Soft and nontender. Normal bowel sounds. No distention or masses noted.  Pelvic: Normal female anatomy.  Cervix not well visualized as she was so tense.  No CMT tenderness.  Adnexa nonpalpable. Musculoskeletal: Strength 5/5 BUE/BLE.  No difficulty with gait.  Neurological: Alert and oriented. Cranial nerves II-XII grossly intact. Coordination normal.  Psychiatric: Mood and affect normal. Behavior is normal. Judgment and thought content normal.   BMET    Component Value Date/Time   NA 140 07/05/2021 0950   K 4.7 07/05/2021 0950   CL 102 07/05/2021 0950   CO2 30 07/05/2021 0950   GLUCOSE 93 07/05/2021 0950   BUN 26 (H) 07/05/2021 0950   CREATININE 0.83 07/05/2021 0950   CALCIUM 10.5 (H) 07/05/2021 0950    Lipid Panel     Component Value Date/Time   CHOL 178 07/05/2021 0950   TRIG 110 07/05/2021 0950   HDL 70 07/05/2021 0950   CHOLHDL 2.5 07/05/2021 0950   VLDL 24.4 06/29/2020 1449   LDLCALC 87 07/05/2021 0950    CBC    Component Value Date/Time   WBC 5.9 07/05/2021 0950   RBC 4.56 07/05/2021 0950   HGB 13.5 07/05/2021 0950   HCT 40.4 07/05/2021 0950   PLT 299 07/05/2021 0950   MCV 88.6 07/05/2021 0950   MCH 29.6  07/05/2021 0950   MCHC 33.4 07/05/2021 0950   RDW 12.8 07/05/2021 0950    Hgb A1C No results found for: "HGBA1C"          Assessment & Plan:   Preventative Health Maintenance:  Encouraged her to get a flu shot in the fall She declines tetanus today Encouraged her to get her COVID booster Discussed Shingrix vaccine, she will check coverage with her insurance company and schedule visit if she would like to have this done Pap smear today-she declines STD screening Mammogram ordered, every 6 months for monitoring Bone density UTD Cologuard ordered Encouraged her to consume a balanced diet and exercise regimen Advised her to see an eye doctor and dentist annually Will check CBC, CMET, Lipid profile today  RTC in  1 year for your annual exam Webb Silversmith, NP

## 2022-08-08 NOTE — Patient Instructions (Signed)
Health Maintenance for Postmenopausal Women Menopause is a normal process in which your ability to get pregnant comes to an end. This process happens slowly over many months or years, usually between the ages of 48 and 55. Menopause is complete when you have missed your menstrual period for 12 months. It is important to talk with your health care provider about some of the most common conditions that affect women after menopause (postmenopausal women). These include heart disease, cancer, and bone loss (osteoporosis). Adopting a healthy lifestyle and getting preventive care can help to promote your health and wellness. The actions you take can also lower your chances of developing some of these common conditions. What are the signs and symptoms of menopause? During menopause, you may have the following symptoms: Hot flashes. These can be moderate or severe. Night sweats. Decrease in sex drive. Mood swings. Headaches. Tiredness (fatigue). Irritability. Memory problems. Problems falling asleep or staying asleep. Talk with your health care provider about treatment options for your symptoms. Do I need hormone replacement therapy? Hormone replacement therapy is effective in treating symptoms that are caused by menopause, such as hot flashes and night sweats. Hormone replacement carries certain risks, especially as you become older. If you are thinking about using estrogen or estrogen with progestin, discuss the benefits and risks with your health care provider. How can I reduce my risk for heart disease and stroke? The risk of heart disease, heart attack, and stroke increases as you age. One of the causes may be a change in the body's hormones during menopause. This can affect how your body uses dietary fats, triglycerides, and cholesterol. Heart attack and stroke are medical emergencies. There are many things that you can do to help prevent heart disease and stroke. Watch your blood pressure High  blood pressure causes heart disease and increases the risk of stroke. This is more likely to develop in people who have high blood pressure readings or are overweight. Have your blood pressure checked: Every 3-5 years if you are 18-39 years of age. Every year if you are 40 years old or older. Eat a healthy diet  Eat a diet that includes plenty of vegetables, fruits, low-fat dairy products, and lean protein. Do not eat a lot of foods that are high in solid fats, added sugars, or sodium. Get regular exercise Get regular exercise. This is one of the most important things you can do for your health. Most adults should: Try to exercise for at least 150 minutes each week. The exercise should increase your heart rate and make you sweat (moderate-intensity exercise). Try to do strengthening exercises at least twice each week. Do these in addition to the moderate-intensity exercise. Spend less time sitting. Even light physical activity can be beneficial. Other tips Work with your health care provider to achieve or maintain a healthy weight. Do not use any products that contain nicotine or tobacco. These products include cigarettes, chewing tobacco, and vaping devices, such as e-cigarettes. If you need help quitting, ask your health care provider. Know your numbers. Ask your health care provider to check your cholesterol and your blood sugar (glucose). Continue to have your blood tested as directed by your health care provider. Do I need screening for cancer? Depending on your health history and family history, you may need to have cancer screenings at different stages of your life. This may include screening for: Breast cancer. Cervical cancer. Lung cancer. Colorectal cancer. What is my risk for osteoporosis? After menopause, you may be   at increased risk for osteoporosis. Osteoporosis is a condition in which bone destruction happens more quickly than new bone creation. To help prevent osteoporosis or  the bone fractures that can happen because of osteoporosis, you may take the following actions: If you are 19-50 years old, get at least 1,000 mg of calcium and at least 600 international units (IU) of vitamin D per day. If you are older than age 50 but younger than age 70, get at least 1,200 mg of calcium and at least 600 international units (IU) of vitamin D per day. If you are older than age 70, get at least 1,200 mg of calcium and at least 800 international units (IU) of vitamin D per day. Smoking and drinking excessive alcohol increase the risk of osteoporosis. Eat foods that are rich in calcium and vitamin D, and do weight-bearing exercises several times each week as directed by your health care provider. How does menopause affect my mental health? Depression may occur at any age, but it is more common as you become older. Common symptoms of depression include: Feeling depressed. Changes in sleep patterns. Changes in appetite or eating patterns. Feeling an overall lack of motivation or enjoyment of activities that you previously enjoyed. Frequent crying spells. Talk with your health care provider if you think that you are experiencing any of these symptoms. General instructions See your health care provider for regular wellness exams and vaccines. This may include: Scheduling regular health, dental, and eye exams. Getting and maintaining your vaccines. These include: Influenza vaccine. Get this vaccine each year before the flu season begins. Pneumonia vaccine. Shingles vaccine. Tetanus, diphtheria, and pertussis (Tdap) booster vaccine. Your health care provider may also recommend other immunizations. Tell your health care provider if you have ever been abused or do not feel safe at home. Summary Menopause is a normal process in which your ability to get pregnant comes to an end. This condition causes hot flashes, night sweats, decreased interest in sex, mood swings, headaches, or lack  of sleep. Treatment for this condition may include hormone replacement therapy. Take actions to keep yourself healthy, including exercising regularly, eating a healthy diet, watching your weight, and checking your blood pressure and blood sugar levels. Get screened for cancer and depression. Make sure that you are up to date with all your vaccines. This information is not intended to replace advice given to you by your health care provider. Make sure you discuss any questions you have with your health care provider. Document Revised: 10/03/2020 Document Reviewed: 10/03/2020 Elsevier Patient Education  2023 [employer]  

## 2022-08-08 NOTE — Assessment & Plan Note (Signed)
Encourage calcium and vitamin D OTC Encourage daily weightbearing exercise

## 2022-08-08 NOTE — Assessment & Plan Note (Signed)
Stable on propranolol and Xanax Support offered

## 2022-08-08 NOTE — Assessment & Plan Note (Signed)
-  Continue propranolol 

## 2022-08-09 LAB — COMPLETE METABOLIC PANEL WITH GFR
AG Ratio: 1.8 (calc) (ref 1.0–2.5)
ALT: 13 U/L (ref 6–29)
AST: 17 U/L (ref 10–35)
Albumin: 4.5 g/dL (ref 3.6–5.1)
Alkaline phosphatase (APISO): 95 U/L (ref 37–153)
BUN: 23 mg/dL (ref 7–25)
CO2: 28 mmol/L (ref 20–32)
Calcium: 10 mg/dL (ref 8.6–10.4)
Chloride: 102 mmol/L (ref 98–110)
Creat: 0.82 mg/dL (ref 0.50–1.03)
Globulin: 2.5 g/dL (calc) (ref 1.9–3.7)
Glucose, Bld: 99 mg/dL (ref 65–139)
Potassium: 4.4 mmol/L (ref 3.5–5.3)
Sodium: 139 mmol/L (ref 135–146)
Total Bilirubin: 0.5 mg/dL (ref 0.2–1.2)
Total Protein: 7 g/dL (ref 6.1–8.1)
eGFR: 83 mL/min/{1.73_m2} (ref >=60)

## 2022-08-09 LAB — CYTOLOGY - PAP
Comment: NEGATIVE
Diagnosis: NEGATIVE
High risk HPV: NEGATIVE

## 2022-08-09 LAB — CBC
HCT: 39.2 % (ref 35.0–45.0)
Hemoglobin: 12.9 g/dL (ref 11.7–15.5)
MCH: 29.1 pg (ref 27.0–33.0)
MCHC: 32.9 g/dL (ref 32.0–36.0)
MCV: 88.5 fL (ref 80.0–100.0)
MPV: 9.6 fL (ref 7.5–12.5)
Platelets: 359 10*3/uL (ref 140–400)
RBC: 4.43 10*6/uL (ref 3.80–5.10)
RDW: 12.5 % (ref 11.0–15.0)
WBC: 9.9 10*3/uL (ref 3.8–10.8)

## 2022-08-09 LAB — LIPID PANEL
Cholesterol: 170 mg/dL (ref <200)
HDL: 63 mg/dL (ref >=50)
LDL Cholesterol (Calc): 86 mg/dL (calc)
Non-HDL Cholesterol (Calc): 107 mg/dL (calc) (ref <130)
Total CHOL/HDL Ratio: 2.7 (calc) (ref <5.0)
Triglycerides: 115 mg/dL (ref <150)

## 2022-08-20 ENCOUNTER — Ambulatory Visit
Admission: RE | Admit: 2022-08-20 | Discharge: 2022-08-20 | Disposition: A | Discharge status: Home / Self Care | Payer: Commercial Managed Care - PPO | Source: Ambulatory Visit | Attending: Internal Medicine | Admitting: Internal Medicine

## 2022-08-20 DIAGNOSIS — Z1231 Encounter for screening mammogram for malignant neoplasm of breast: Secondary | ICD-10-CM | POA: Diagnosis present

## 2022-09-21 LAB — COLOGUARD: COLOGUARD: NEGATIVE

## 2022-10-27 HISTORY — PX: MELANOMA EXCISION: SHX5266

## 2023-04-08 ENCOUNTER — Other Ambulatory Visit: Payer: Self-pay | Admitting: Internal Medicine

## 2023-04-08 DIAGNOSIS — F411 Generalized anxiety disorder: Secondary | ICD-10-CM

## 2023-04-10 MED ORDER — ALPRAZOLAM 0.25 MG PO TABS: 0.1250 mg | ORAL_TABLET | Freq: Every evening | ORAL | 0 refills | Status: AC | PRN

## 2023-07-31 ENCOUNTER — Other Ambulatory Visit: Payer: Self-pay | Admitting: Internal Medicine

## 2023-07-31 DIAGNOSIS — N63 Unspecified lump in unspecified breast: Secondary | ICD-10-CM

## 2023-08-12 ENCOUNTER — Encounter: Payer: Self-pay | Admitting: Internal Medicine

## 2023-08-12 ENCOUNTER — Ambulatory Visit (INDEPENDENT_AMBULATORY_CARE_PROVIDER_SITE_OTHER): Payer: Commercial Managed Care - PPO | Admitting: Internal Medicine

## 2023-08-12 VITALS — BP 118/78 | Ht 62.0 in | Wt 134.2 lb

## 2023-08-12 DIAGNOSIS — Z136 Encounter for screening for cardiovascular disorders: Secondary | ICD-10-CM | POA: Diagnosis not present

## 2023-08-12 DIAGNOSIS — Z23 Encounter for immunization: Secondary | ICD-10-CM | POA: Diagnosis not present

## 2023-08-12 DIAGNOSIS — G43019 Migraine without aura, intractable, without status migrainosus: Secondary | ICD-10-CM | POA: Diagnosis not present

## 2023-08-12 DIAGNOSIS — F411 Generalized anxiety disorder: Secondary | ICD-10-CM

## 2023-08-12 DIAGNOSIS — Z0000 Encounter for general adult medical examination without abnormal findings: Secondary | ICD-10-CM | POA: Diagnosis not present

## 2023-08-12 DIAGNOSIS — M816 Localized osteoporosis [Lequesne]: Secondary | ICD-10-CM

## 2023-08-12 DIAGNOSIS — R739 Hyperglycemia, unspecified: Secondary | ICD-10-CM

## 2023-08-12 MED ORDER — PROPRANOLOL HCL ER 60 MG PO CP24: 60.0000 mg | ORAL_CAPSULE | Freq: Every day | ORAL | 3 refills | Status: AC

## 2023-08-12 NOTE — Assessment & Plan Note (Signed)
 Stable on propranolol and xanax Support offered

## 2023-08-12 NOTE — Patient Instructions (Signed)
 Health Maintenance for Postmenopausal Women Menopause is a normal process in which your ability to get pregnant comes to an end. This process happens slowly over many months or years, usually between the ages of 24 and 62. Menopause is complete when you have missed your menstrual period for 12 months. It is important to talk with your health care provider about some of the most common conditions that affect women after menopause (postmenopausal women). These include heart disease, cancer, and bone loss (osteoporosis). Adopting a healthy lifestyle and getting preventive care can help to promote your health and wellness. The actions you take can also lower your chances of developing some of these common conditions. What are the signs and symptoms of menopause? During menopause, you may have the following symptoms: Hot flashes. These can be moderate or severe. Night sweats. Decrease in sex drive. Mood swings. Headaches. Tiredness (fatigue). Irritability. Memory problems. Problems falling asleep or staying asleep. Talk with your health care provider about treatment options for your symptoms. Do I need hormone replacement therapy? Hormone replacement therapy is effective in treating symptoms that are caused by menopause, such as hot flashes and night sweats. Hormone replacement carries certain risks, especially as you become older. If you are thinking about using estrogen or estrogen with progestin, discuss the benefits and risks with your health care provider. How can I reduce my risk for heart disease and stroke? The risk of heart disease, heart attack, and stroke increases as you age. One of the causes may be a change in the body's hormones during menopause. This can affect how your body uses dietary fats, triglycerides, and cholesterol. Heart attack and stroke are medical emergencies. There are many things that you can do to help prevent heart disease and stroke. Watch your blood pressure High  blood pressure causes heart disease and increases the risk of stroke. This is more likely to develop in people who have high blood pressure readings or are overweight. Have your blood pressure checked: Every 3-5 years if you are 50-75 years of age. Every year if you are 77 years old or older. Eat a healthy diet  Eat a diet that includes plenty of vegetables, fruits, low-fat dairy products, and lean protein. Do not eat a lot of foods that are high in solid fats, added sugars, or sodium. Get regular exercise Get regular exercise. This is one of the most important things you can do for your health. Most adults should: Try to exercise for at least 150 minutes each week. The exercise should increase your heart rate and make you sweat (moderate-intensity exercise). Try to do strengthening exercises at least twice each week. Do these in addition to the moderate-intensity exercise. Spend less time sitting. Even light physical activity can be beneficial. Other tips Work with your health care provider to achieve or maintain a healthy weight. Do not use any products that contain nicotine or tobacco. These products include cigarettes, chewing tobacco, and vaping devices, such as e-cigarettes. If you need help quitting, ask your health care provider. Know your numbers. Ask your health care provider to check your cholesterol and your blood sugar (glucose). Continue to have your blood tested as directed by your health care provider. Do I need screening for cancer? Depending on your health history and family history, you may need to have cancer screenings at different stages of your life. This may include screening for: Breast cancer. Cervical cancer. Lung cancer. Colorectal cancer. What is my risk for osteoporosis? After menopause, you may be  at increased risk for osteoporosis. Osteoporosis is a condition in which bone destruction happens more quickly than new bone creation. To help prevent osteoporosis or  the bone fractures that can happen because of osteoporosis, you may take the following actions: If you are 61-3 years old, get at least 1,000 mg of calcium and at least 600 international units (IU) of vitamin D per day. If you are older than age 61 but younger than age 75, get at least 1,200 mg of calcium and at least 600 international units (IU) of vitamin D per day. If you are older than age 62, get at least 1,200 mg of calcium and at least 800 international units (IU) of vitamin D per day. Smoking and drinking excessive alcohol increase the risk of osteoporosis. Eat foods that are rich in calcium and vitamin D, and do weight-bearing exercises several times each week as directed by your health care provider. How does menopause affect my mental health? Depression may occur at any age, but it is more common as you become older. Common symptoms of depression include: Feeling depressed. Changes in sleep patterns. Changes in appetite or eating patterns. Feeling an overall lack of motivation or enjoyment of activities that you previously enjoyed. Frequent crying spells. Talk with your health care provider if you think that you are experiencing any of these symptoms. General instructions See your health care provider for regular wellness exams and vaccines. This may include: Scheduling regular health, dental, and eye exams. Getting and maintaining your vaccines. These include: Influenza vaccine. Get this vaccine each year before the flu season begins. Pneumonia vaccine. Shingles vaccine. Tetanus, diphtheria, and pertussis (Tdap) booster vaccine. Your health care provider may also recommend other immunizations. Tell your health care provider if you have ever been abused or do not feel safe at home. Summary Menopause is a normal process in which your ability to get pregnant comes to an end. This condition causes hot flashes, night sweats, decreased interest in sex, mood swings, headaches, or lack  of sleep. Treatment for this condition may include hormone replacement therapy. Take actions to keep yourself healthy, including exercising regularly, eating a healthy diet, watching your weight, and checking your blood pressure and blood sugar levels. Get screened for cancer and depression. Make sure that you are up to date with all your vaccines. This information is not intended to replace advice given to you by your health care provider. Make sure you discuss any questions you have with your health care provider. Document Revised: 10/03/2020 Document Reviewed: 10/03/2020 Elsevier Patient Education  2024 [employer].

## 2023-08-12 NOTE — Assessment & Plan Note (Signed)
Encourage calcium and vitamin D OTC Encourage daily weightbearing exercise 

## 2023-08-12 NOTE — Assessment & Plan Note (Signed)
 Continue propranolol, refilled today

## 2023-08-12 NOTE — Progress Notes (Signed)
 Subjective:    Patient ID: Veronica Leach, female    DOB: [birth year], 58 y.o.   MRN: [id number]  HPI  Patient presents to clinic today for her annual exam.  She is also due to follow-up chronic conditions.  Migraines: These occur rarely.  Managed with propranolol.  She does not follow with neurology.   Osteoporosis: Her last bone density from 08/2020 reviewed.  She reports she was on boniva for 10 years and did not see any improvement in her bone density.  She is taking calcium and vitamin d OTC and getting weightbearing exercise daily.   Anxiety: Chronic, managed with propranolol and xanax.  She is not currently seeing a therapist.  She denies depression, SI/HI.  Flu: 04/2022 Tetanus: 06/2012 COVID: Pfizer x 1 Shingrix: Never Pap smear: 07/2022 Mammogram: 07/2022 Bone density: 06/2020 Colon screening: 08/2022, Cologuard Vision screening: annually Dentist: biannually  Diet: She does eat some meat. She consumes fruits and veggies. She does eat fried foods. She drinks mostly water, herbal tea Exercise: 3 x week, weights and walking  Review of Systems     Past Medical History:  Diagnosis Date   Anxiety    Chicken pox    History of migraine     Current Outpatient Medications  Medication Sig Dispense Refill   ALPRAZolam (XANAX) 0.25 MG tablet Take 0.5 tablets (0.125 mg total) by mouth at bedtime as needed for anxiety. 30 tablet 0   propranolol ER (INDERAL LA) 60 MG 24 hr capsule Take 1 capsule (60 mg total) by mouth daily. 90 capsule 3   No current facility-administered medications for this visit.    No Known Allergies  Family History  Problem Relation Age of Onset   Arthritis Mother    Arthritis Father    Breast cancer Maternal Aunt 60   Colon cancer Neg Hx     Social History   Socioeconomic History   Marital status: Married    Spouse name: Not on file   Number of children: Not on file   Years of education: Not on file   Highest education level: Not on file   Occupational History   Not on file  Tobacco Use   Smoking status: Never   Smokeless tobacco: Never  Vaping Use   Vaping status: Never Used  Substance and Sexual Activity   Alcohol use: Yes    Alcohol/week: 0.0 standard drinks of alcohol    Comment: occasional   Drug use: No   Sexual activity: Yes  Other Topics Concern   Not on file  Social History Narrative   Not on file   Social Drivers of Health   Financial Resource Strain: Not on file  Food Insecurity: Not on file  Transportation Needs: Not on file  Physical Activity: Not on file  Stress: Not on file  Social Connections: Not on file  Intimate Partner Violence: Not on file     Constitutional: Patient reports intermittent headaches.  Denies fever, malaise, fatigue, or abrupt weight changes.  HEENT: Denies eye pain, eye redness, ear pain, ringing in the ears, wax buildup, runny nose, nasal congestion, bloody nose, or sore throat. Respiratory: Denies difficulty breathing, shortness of breath, cough or sputum production.   Cardiovascular: Denies chest pain, chest tightness, palpitations or swelling in the hands or feet.  Gastrointestinal: Denies abdominal pain, bloating, constipation, diarrhea or blood in the stool.  GU: Denies urgency, frequency, pain with urination, burning sensation, blood in urine, odor or discharge. Musculoskeletal: Denies decrease in range  of motion, difficulty with gait, muscle pain or joint pain and swelling.  Skin: Denies redness, rashes, lesions or ulcercations.  Neurological: Denies dizziness, difficulty with memory, difficulty with speech or problems with balance and coordination.  Psych: Patient has a history of anxiety.  Denies depression, SI/HI.  No other specific complaints in a complete review of systems (except as listed in HPI above).   Objective:   Physical Exam  BP 118/78 (BP Location: Left Arm, Patient Position: Sitting, Cuff Size: Normal)   Ht 5\' 2"  (1.575 m)   Wt 134 lb 3.2 oz  (60.9 kg)   LMP 10/26/2012   BMI 24.55 kg/m    Wt Readings from Last 3 Encounters:  08/08/22 132 lb (59.9 kg)  07/05/21 133 lb (60.3 kg)  06/29/20 128 lb (58.1 kg)    General: Appears her stated age, well developed, well nourished in NAD. Skin: Warm, dry and intact.  HEENT: Head: normal shape and size; Eyes: sclera white, no icterus, conjunctiva pink, PERRLA and EOMs intact;  Neck:  Neck supple, trachea midline. No masses, lumps or thyromegaly present.  Cardiovascular: Normal rate and rhythm. S1,S2 noted.  No murmur, rubs or gallops noted. No JVD or BLE edema. No carotid bruits noted. Pulmonary/Chest: Normal effort and positive vesicular breath sounds. No respiratory distress. No wheezes, rales or ronchi noted.  Abdomen: Normal bowel sounds.  Musculoskeletal: Strength 5/5 BUE/BLE.  No difficulty with gait.  Neurological: Alert and oriented. Cranial nerves II-XII grossly intact. Coordination normal.  Psychiatric: Mood and affect normal. Behavior is normal. Judgment and thought content normal.   BMET    Component Value Date/Time   NA 139 08/08/2022 0850   K 4.4 08/08/2022 0850   CL 102 08/08/2022 0850   CO2 28 08/08/2022 0850   GLUCOSE 99 08/08/2022 0850   BUN 23 08/08/2022 0850   CREATININE 0.82 08/08/2022 0850   CALCIUM 10.0 08/08/2022 0850    Lipid Panel     Component Value Date/Time   CHOL 170 08/08/2022 0850   TRIG 115 08/08/2022 0850   HDL 63 08/08/2022 0850   CHOLHDL 2.7 08/08/2022 0850   VLDL 24.4 06/29/2020 1449   LDLCALC 86 08/08/2022 0850    CBC    Component Value Date/Time   WBC 9.9 08/08/2022 0850   RBC 4.43 08/08/2022 0850   HGB 12.9 08/08/2022 0850   HCT 39.2 08/08/2022 0850   PLT 359 08/08/2022 0850   MCV 88.5 08/08/2022 0850   MCH 29.1 08/08/2022 0850   MCHC 32.9 08/08/2022 0850   RDW 12.5 08/08/2022 0850    Hgb A1C No results found for: "HGBA1C"          Assessment & Plan:   Preventative Health Maintenance:  Flu shot declined  today Tdap today Encouraged her to get her COVID booster Discussed Shingrix vaccine, she will check coverage with her insurance company and schedule visit if she would like to have this done Pap smear UTD Mammogram scheduled Bone density UTD Colon screening UTD Encouraged her to consume a balanced diet and exercise regimen Advised her to see an eye doctor and dentist annually Will check CBC, CMET, Lipid profile and A1c today  RTC in 6 months, follow-up chronic conditions Nicki Reaper, NP

## 2023-08-13 ENCOUNTER — Encounter: Payer: Self-pay | Admitting: Internal Medicine

## 2023-08-13 LAB — CBC
HCT: 39.7 % (ref 35.0–45.0)
Hemoglobin: 13.2 g/dL (ref 11.7–15.5)
MCH: 29.8 pg (ref 27.0–33.0)
MCHC: 33.2 g/dL (ref 32.0–36.0)
MCV: 89.6 fL (ref 80.0–100.0)
MPV: 10 fL (ref 7.5–12.5)
Platelets: 297 10*3/uL (ref 140–400)
RBC: 4.43 10*6/uL (ref 3.80–5.10)
RDW: 13 % (ref 11.0–15.0)
WBC: 11.4 10*3/uL — ABNORMAL HIGH (ref 3.8–10.8)

## 2023-08-13 LAB — LIPID PANEL
Cholesterol: 161 mg/dL
HDL: 61 mg/dL
LDL Cholesterol (Calc): 77 mg/dL
Non-HDL Cholesterol (Calc): 100 mg/dL
Total CHOL/HDL Ratio: 2.6 (calc)
Triglycerides: 131 mg/dL

## 2023-08-13 LAB — HEMOGLOBIN A1C
Hgb A1c MFr Bld: 5.6 %{Hb} (ref <5.7)
Mean Plasma Glucose: 114 mg/dL
eAG (mmol/L): 6.3 mmol/L

## 2023-08-13 LAB — COMPLETE METABOLIC PANEL WITH GFR
AG Ratio: 1.7 (calc) (ref 1.0–2.5)
ALT: 11 U/L (ref 6–29)
AST: 17 U/L (ref 10–35)
Albumin: 4.5 g/dL (ref 3.6–5.1)
Alkaline phosphatase (APISO): 94 U/L (ref 37–153)
BUN/Creatinine Ratio: 36 (calc) — ABNORMAL HIGH (ref 6–22)
BUN: 31 mg/dL — ABNORMAL HIGH (ref 7–25)
CO2: 28 mmol/L (ref 20–32)
Calcium: 10.1 mg/dL (ref 8.6–10.4)
Chloride: 102 mmol/L (ref 98–110)
Creat: 0.87 mg/dL (ref 0.50–1.03)
Globulin: 2.6 g/dL (ref 1.9–3.7)
Glucose, Bld: 93 mg/dL (ref 65–139)
Potassium: 4.1 mmol/L (ref 3.5–5.3)
Sodium: 139 mmol/L (ref 135–146)
Total Bilirubin: 0.4 mg/dL (ref 0.2–1.2)
Total Protein: 7.1 g/dL (ref 6.1–8.1)
eGFR: 77 mL/min/{1.73_m2} (ref >=60)

## 2023-09-03 ENCOUNTER — Other Ambulatory Visit: Payer: Self-pay | Admitting: Internal Medicine

## 2023-09-03 ENCOUNTER — Ambulatory Visit
Admission: RE | Admit: 2023-09-03 | Discharge: 2023-09-03 | Disposition: A | Discharge status: Home / Self Care | Source: Ambulatory Visit | Attending: Internal Medicine | Admitting: Internal Medicine

## 2023-09-03 DIAGNOSIS — R928 Other abnormal and inconclusive findings on diagnostic imaging of breast: Secondary | ICD-10-CM

## 2023-09-03 DIAGNOSIS — N63 Unspecified lump in unspecified breast: Secondary | ICD-10-CM | POA: Diagnosis present

## 2024-01-28 IMAGING — US US BREAST*L* LIMITED INC AXILLA
1 series · 2 of 2 positions shown · non-contrast
Comparison: Previous exam(s).

CLINICAL DATA: Recall from screening to evaluate a possible left
breast mass.

EXAM:
DIGITAL DIAGNOSTIC UNILATERAL LEFT MAMMOGRAM WITH TOMOSYNTHESIS AND
CAD; ULTRASOUND LEFT BREAST LIMITED
TECHNIQUE: Left digital diagnostic mammography and breast tomosynthesis was
performed. The images were evaluated with computer-aided detection.;
Targeted ultrasound examination of the left breast was performed.

[Series 2: us breast*left* limited inc axilla · 0.05mm/px · 2 of 2 slices shown]
[im 1/2]
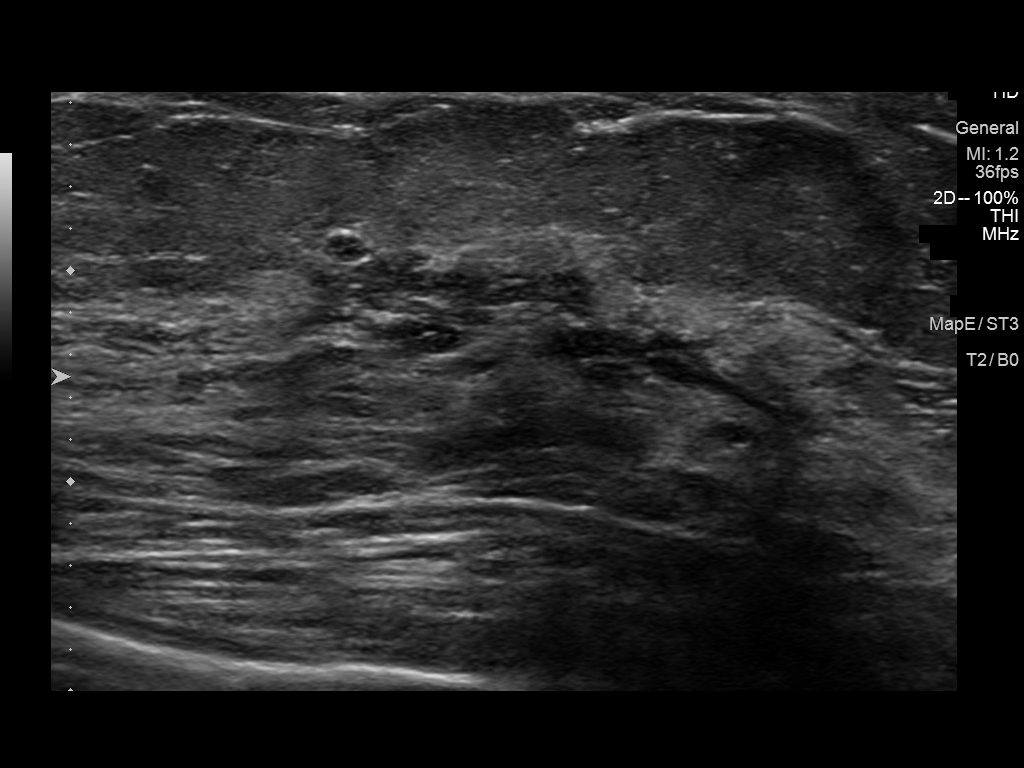
[im 2/2]
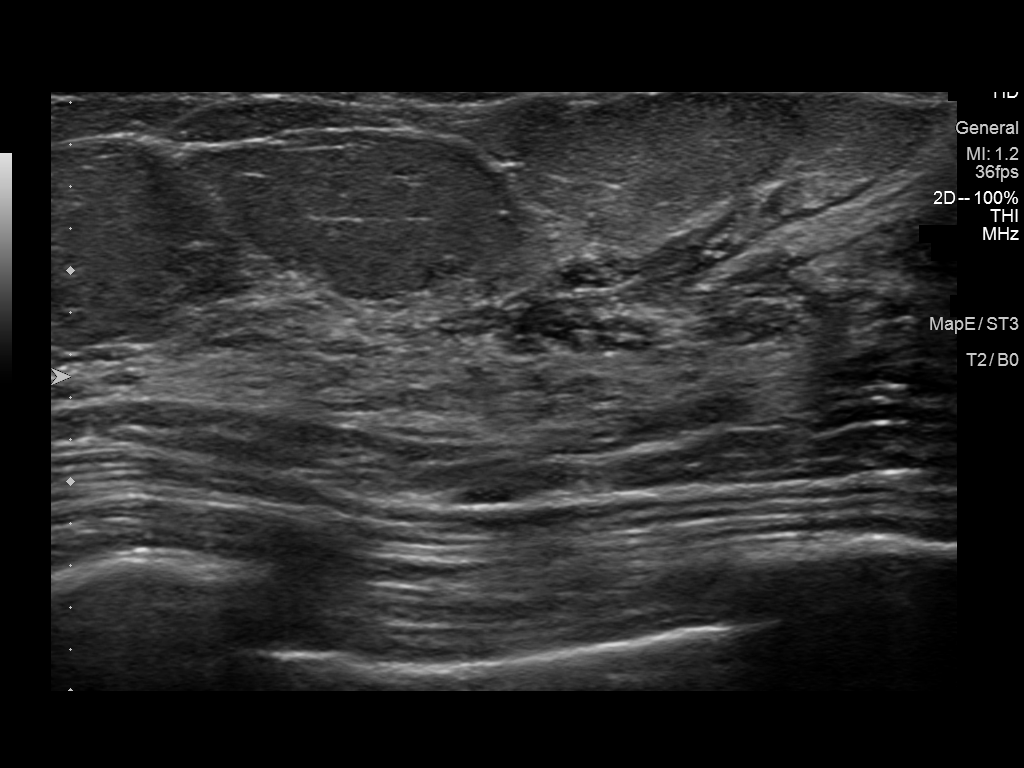

[2 of 2 positions shown; findings below may reference images not displayed]

ACR Breast Density Category c: The breast tissue is heterogeneously
dense, which may obscure small masses.
FINDINGS: Additional spot compression and true lateral tomographic images were
obtained. There is an oval subcentimeter partially circumscribed and
partially obscured mass over the middle third of the central left
breast.

Targeted ultrasound is performed, showing focal fibrocystic change
over the 12 o'clock position of the left breast 1 cm from the nipple
in the area of the mammographic finding. No definite focal mass is
seen in this area.
IMPRESSION: Probable benign subcentimeter mass over the central left breast
without sonographic correlate. Finding is likely due to focal
fibrocystic change.

RECOMMENDATION:
Recommend a six-month follow-up diagnostic left breast mammogram to
document stability.

I have discussed the findings and recommendations with the patient.
If applicable, a reminder letter will be sent to the patient
regarding the next appointment.

BI-RADS CATEGORY  3: Probably benign.

## 2024-01-28 IMAGING — MG MM DIGITAL DIAGNOSTIC UNILAT*L* W/ TOMO W/ CAD
6 series · 6 of 18 positions shown · non-contrast
Comparison: Previous exam(s).

CLINICAL DATA: Recall from screening to evaluate a possible left
breast mass.

EXAM:
DIGITAL DIAGNOSTIC UNILATERAL LEFT MAMMOGRAM WITH TOMOSYNTHESIS AND
CAD; ULTRASOUND LEFT BREAST LIMITED
TECHNIQUE: Left digital diagnostic mammography and breast tomosynthesis was
performed. The images were evaluated with computer-aided detection.;
Targeted ultrasound examination of the left breast was performed.

[L MLO synth-2D]
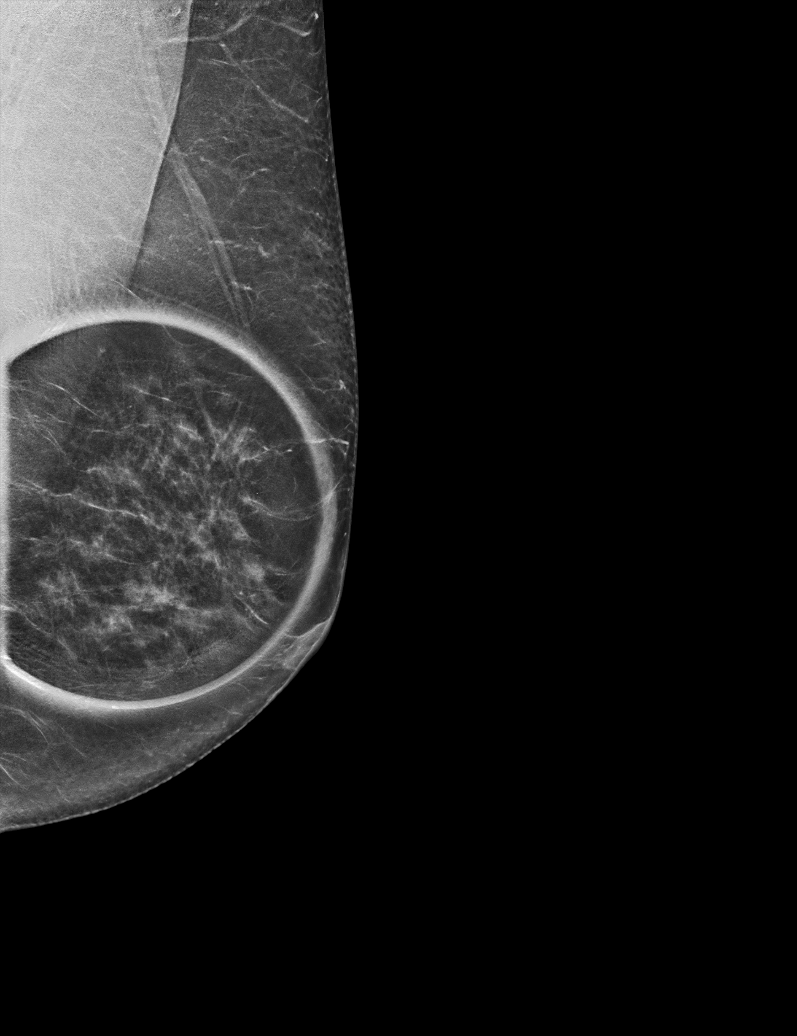

[L ML synth-2D]
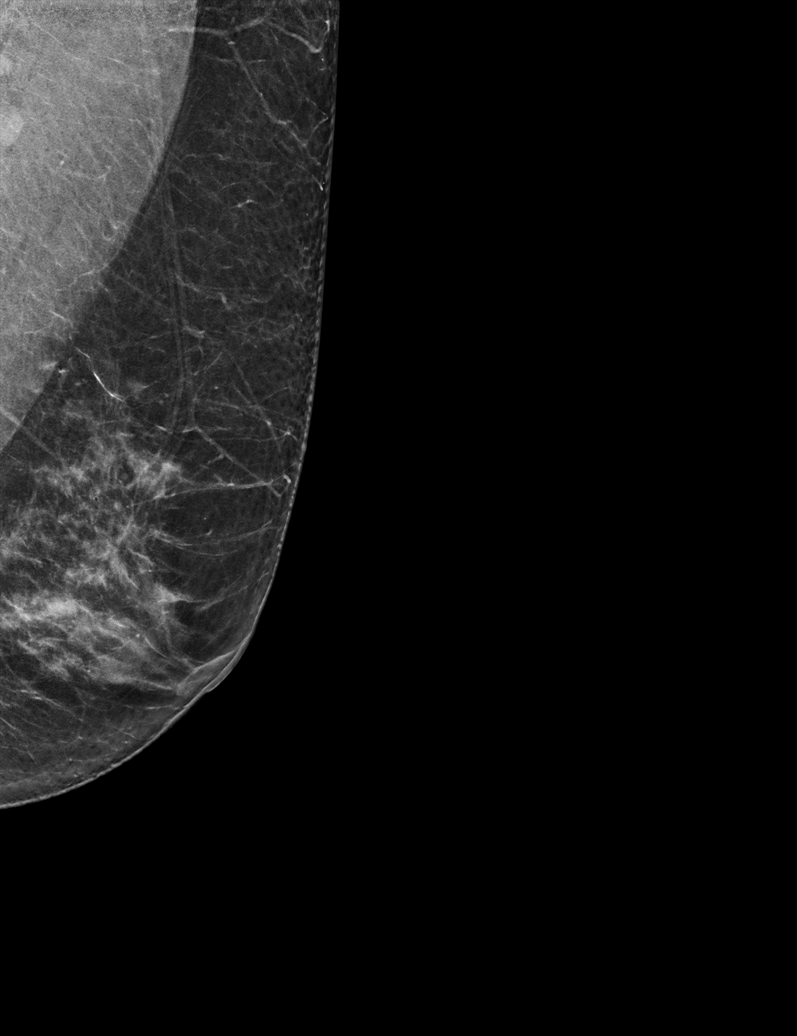

[L CC synth-2D]
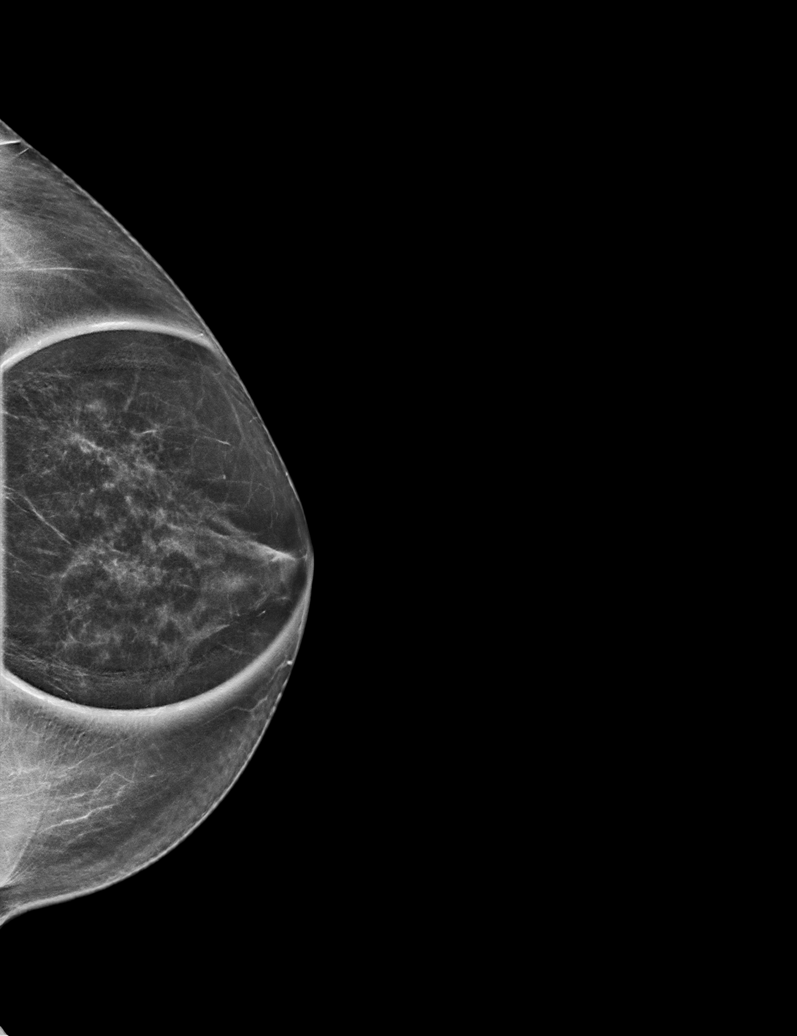

[L CC tomo · tomo slice 29/57.0]
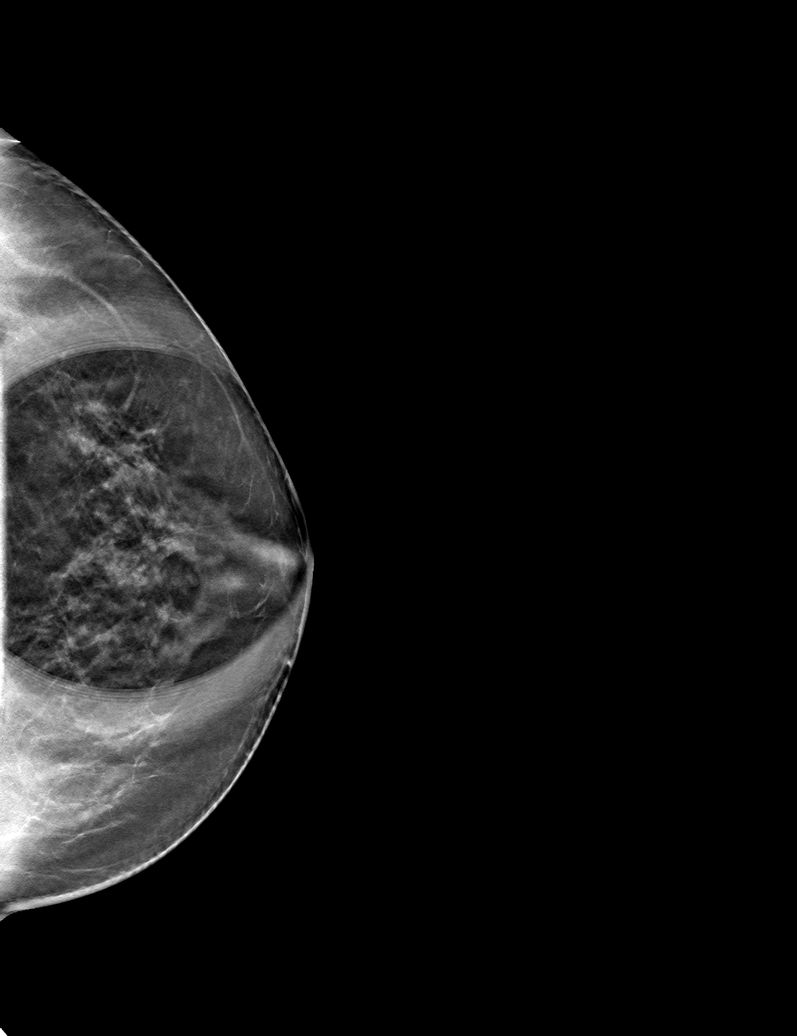

[L MLO tomo · tomo slice 29/56.0]
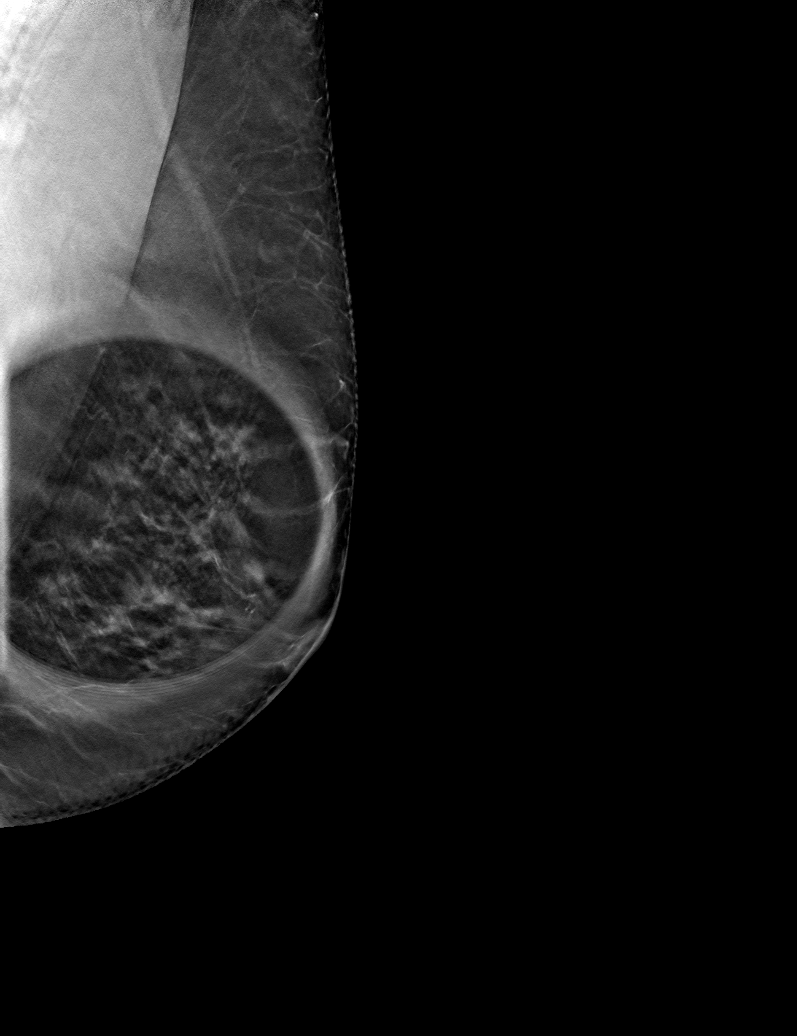

[L ML tomo · tomo slice 32/63.0]
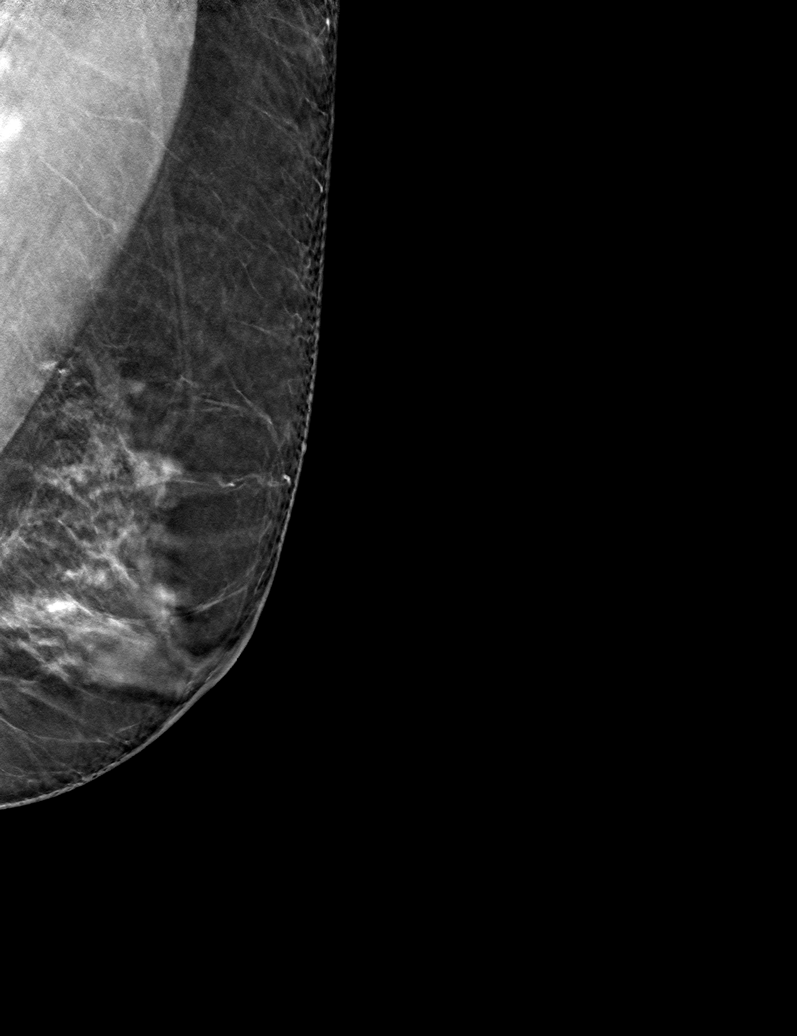

[6 of 18 positions shown; findings below may reference images not displayed]

ACR Breast Density Category c: The breast tissue is heterogeneously
dense, which may obscure small masses.
FINDINGS: Additional spot compression and true lateral tomographic images were
obtained. There is an oval subcentimeter partially circumscribed and
partially obscured mass over the middle third of the central left
breast.

Targeted ultrasound is performed, showing focal fibrocystic change
over the 12 o'clock position of the left breast 1 cm from the nipple
in the area of the mammographic finding. No definite focal mass is
seen in this area.
IMPRESSION: Probable benign subcentimeter mass over the central left breast
without sonographic correlate. Finding is likely due to focal
fibrocystic change.

RECOMMENDATION:
Recommend a six-month follow-up diagnostic left breast mammogram to
document stability.

I have discussed the findings and recommendations with the patient.
If applicable, a reminder letter will be sent to the patient
regarding the next appointment.

BI-RADS CATEGORY  3: Probably benign.
